# Patient Record
Sex: Male | Born: 1955 | Race: White | Hispanic: No | Marital: Married | Smoking: Never smoker
Health system: Southern US, Community
[De-identification: ages and names within clinical notes are randomized; demographics above are authoritative.]

## PROBLEM LIST (undated history)

## (undated) ENCOUNTER — Emergency Department (HOSPITAL_COMMUNITY): Payer: 59 | Source: Home / Self Care

---

## 2014-03-20 DIAGNOSIS — F411 Generalized anxiety disorder: Secondary | ICD-10-CM | POA: Insufficient documentation

## 2014-04-17 DIAGNOSIS — F09 Unspecified mental disorder due to known physiological condition: Secondary | ICD-10-CM | POA: Insufficient documentation

## 2015-12-03 ENCOUNTER — Ambulatory Visit: Payer: Self-pay | Admitting: Family Medicine

## 2015-12-11 ENCOUNTER — Encounter: Payer: Self-pay | Admitting: Family Medicine

## 2015-12-11 ENCOUNTER — Ambulatory Visit (INDEPENDENT_AMBULATORY_CARE_PROVIDER_SITE_OTHER): Payer: 59 | Admitting: Family Medicine

## 2015-12-11 VITALS — BP 132/84 | HR 71 | Ht 70.0 in | Wt 185.0 lb

## 2015-12-11 DIAGNOSIS — M79644 Pain in right finger(s): Secondary | ICD-10-CM

## 2015-12-11 NOTE — Patient Instructions (Signed)
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 These are the different medications you can take for this: Take tylenol 500mg  1-2 tabs three times a day for pain. Aleve 1-2 tabs twice a day with food Glucosamine sulfate 750mg  twice a day is a supplement that may help. Capsaicin, aspercreme, flexall 454 or biofreeze topically up to four times a day may also help with pain. Cortisone injections are an option - let me know if this gets bad enough that you want one. Heat 15 minutes at a time 3-4 times a day as needed to help with pain. Consider a U shaped aluminum splint to rest this by immobilizing the joint - we have these but it would be cheaper to buy it at a pharmacy. Follow up with me as needed otherwise.

## 2015-12-12 DIAGNOSIS — M79646 Pain in unspecified finger(s): Secondary | ICD-10-CM | POA: Insufficient documentation

## 2015-12-12 NOTE — Assessment & Plan Note (Signed)
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  Discussed tylenol, nsaids, glucosamine, topical medications, splinting, heat.  Consider injection if not improving.  F/u prn.

## 2015-12-12 NOTE — Progress Notes (Signed)
PCP: No primary care provider on file.  Subjective:   HPI: Patient is a 60 y.o. male here for right thumb pain.  Patient denies known injury. He states he's had off and on pain in right thumb for about a year. Feels like it locks up at IP joint region. + swelling. Has been taking aleve. Pain level is 4/10, sharp. No numbness or tingling. No skin changes.  No past medical history on file.  No current outpatient prescriptions on file prior to visit.   No current facility-administered medications on file prior to visit.    No past surgical history on file.  Allergies  Allergen Reactions  . Sulfa Antibiotics     Social History   Social History  . Marital Status: Married    Spouse Name: N/A  . Number of Children: N/A  . Years of Education: N/A   Occupational History  . Not on file.   Social History Main Topics  . Smoking status: Never Smoker   . Smokeless tobacco: Not on file  . Alcohol Use: Not on file  . Drug Use: Not on file  . Sexual Activity: Not on file   Other Topics Concern  . Not on file   Social History Narrative  . No narrative on file    No family history on file.  BP 132/84 mmHg  Pulse 71  Ht 5\' 10"  (1.778 m)  Wt 185 lb (83.915 kg)  BMI 26.54 kg/m2  Review of Systems: See HPI above.    Objective:  Physical Exam:  Gen: NAD, comfortable in exam room  Right hand: Nodes noted over dorsal DIP, PIP joints.  Swelling of IP joint of thumb.  No bruising, other deformity.  No malrotation or angulation. TTP circumferentially about IP joint.  No tenderness at A1 pulley of thumb.  No other tenderness. Full extension but flexion limited to 45 degrees at IP joint with mild pain.  No catching or locking on movement. NVI distally.    Assessment & Plan:  1. 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  Discussed tylenol, nsaids, glucosamine, topical medications, splinting, heat.  Consider injection if not improving.  F/u prn.

## 2015-12-24 ENCOUNTER — Ambulatory Visit: Payer: 59 | Admitting: Family Medicine

## 2016-01-20 ENCOUNTER — Other Ambulatory Visit (HOSPITAL_COMMUNITY): Payer: Self-pay | Admitting: Orthopaedic Surgery

## 2016-01-20 DIAGNOSIS — M25561 Pain in right knee: Secondary | ICD-10-CM

## 2016-01-24 ENCOUNTER — Ambulatory Visit (HOSPITAL_BASED_OUTPATIENT_CLINIC_OR_DEPARTMENT_OTHER): Payer: 59

## 2016-01-31 ENCOUNTER — Ambulatory Visit
Admission: RE | Admit: 2016-01-31 | Discharge: 2016-01-31 | Disposition: A | Discharge status: Home / Self Care | Payer: 59 | Source: Ambulatory Visit | Attending: Orthopaedic Surgery | Admitting: Orthopaedic Surgery

## 2016-01-31 DIAGNOSIS — M25561 Pain in right knee: Secondary | ICD-10-CM

## 2016-03-02 ENCOUNTER — Ambulatory Visit: Payer: 59 | Attending: Orthopedic Surgery | Admitting: Physical Therapy

## 2016-03-02 DIAGNOSIS — R2689 Other abnormalities of gait and mobility: Secondary | ICD-10-CM | POA: Insufficient documentation

## 2016-03-02 DIAGNOSIS — R262 Difficulty in walking, not elsewhere classified: Secondary | ICD-10-CM | POA: Insufficient documentation

## 2016-03-02 DIAGNOSIS — M25561 Pain in right knee: Secondary | ICD-10-CM | POA: Diagnosis present

## 2016-03-02 DIAGNOSIS — M25661 Stiffness of right knee, not elsewhere classified: Secondary | ICD-10-CM | POA: Insufficient documentation

## 2016-03-03 NOTE — Therapy (Signed)
[facility] [address]  [address] [place], [place], [address] Phone: [phone]   Fax:  [phone]  Physical Therapy Evaluation  Patient Details  Name: Darryl French MRN: [id number] Date of Birth: 12/06/1955 Referring Provider: Norlene Campbell, MD  Encounter Date: 03/02/2016      PT End of Session - 03/02/16 1107    Visit Number 1   Number of Visits 12   Date for PT Re-Evaluation 04/20/16   PT Start Time 1017   PT Stop Time 1107   PT Time Calculation (min) 50 min   Activity Tolerance Patient tolerated treatment well   Behavior During Therapy [facility] for tasks assessed/performed      No past medical history on file.  No past surgical history on file.  There were no vitals filed for this visit.       Subjective Assessment - 03/02/16 1024    Subjective Pt reports he injured his R knee playing disc golf in March or April of this year when he stepped off the tee platform while pivoting and felt a pop in his knee.    Pertinent History R knee arthroscopy 02/19/16   Limitations Sitting;Standing   How long can you sit comfortably? 30 minutes   How long can you stand comfortably? 20-30 minutes   How long can you walk comfortably? uncomfortable with all walking   Patient Stated Goals "develop more flexibility and get back to normal walking pattern"   Currently in Pain? Yes   Pain Score 2   Least 0/10, Avg 3-4/10, Worst 10/10   Pain Location Knee   Pain Orientation Right;Anterior   Pain Descriptors / Indicators Tightness;Aching;Sharp   Pain Type Surgical pain   Pain Radiating Towards anterior thigh   Pain Onset More than a month ago  initial onset 2-3 mo ago, surgery 2 wks ago   Pain Frequency Intermittent   Aggravating Factors  prolonged walking   Pain Relieving Factors Icing, elevation, OTC & prescription pain meds   Effect of Pain on Daily Activities Limps when walking, unable to squat, difficulty with lower body  dressing            [facility] PT Assessment - 03/02/16 1017    Assessment   Medical Diagnosis R knee scope   Referring Provider Norlene Campbell, MD   Onset Date/Surgical Date 02/19/16   Next MD Visit 03/05/16   Prior Therapy none   Balance Screen   Has the patient fallen in the past 6 months No   Has the patient had a decrease in activity level because of a fear of falling?  No   Is the patient reluctant to leave their home because of a fear of falling?  No   Home Environment   Living Environment Private residence   Type of Home House   Home Access Stairs to enter   Entrance Stairs-Number of Steps 3-4   Entrance Stairs-Rails Right   Home Layout Multi-level;Able to live on main level with bedroom/bathroom   Prior Function   Level of Independence Independent   Vocation Full time employment  currently out of work until 03/23/16   Vocation Requirements desk job   Leisure Scientist, physiological, gardening, hiking, outdoors activities   Observation/Other Assessments   Focus on Therapeutic Outcomes (FOTO)  Knee 23% (77% limitation); Predicted 57% (43% limitation)   ROM / Strength   AROM / PROM / Strength AROM;PROM;Strength   AROM   AROM Assessment Site Knee  Right/Left Knee Right;Left   Right Knee Extension 16   Right Knee Flexion 92   Left Knee Extension -2   Left Knee Flexion 143   PROM   PROM Assessment Site Knee   Right/Left Knee Right   Right Knee Extension 5   Right Knee Flexion 101   Strength   Strength Assessment Site Hip;Knee   Right/Left Hip Right;Left   Right Hip Flexion 4-/5   Right Hip Extension 3+/5   Right Hip ABduction 4/5   Right Hip ADduction 3/5   Left Hip Flexion 4/5   Left Hip Extension 4/5   Left Hip ABduction 4+/5   Left Hip ADduction 4/5   Right/Left Knee Right;Left   Right Knee Flexion 3+/5   Right Knee Extension 3+/5   Left Knee Flexion 4+/5   Left Knee Extension 4+/5   Flexibility   Soft Tissue Assessment /Muscle Length yes   Hamstrings mild  tightness   Quadriceps mod tightness on R   Palpation   Palpation comment increased edema throughout anterior & lateral knee with ttp over distal quad   Ambulation/Gait   Gait Pattern Antalgic;Decreased weight shift to right;Decreased stance time - right;Decreased hip/knee flexion - right;Right flexed knee in stance   Gait Comments Provided cues for normalized gait pattern with heel stike on weight acceptance         Today's Treatment  TherEx Quad set with rolled towel under knee 10x5" Supine hamstring stretch with strap 2x30" Hooklying Hip ADD isometric ball squeeze 10x5" 4 way SLR 10x3" (performed for flexion only but instructions/demo provided for remaining directions) Supine/longsitting AAROM heel slides with strap 10x3" Seated knee flexion/heel slides with foot on small green ball x20           PT Education - 03/02/16 1105    Education provided Yes   Education Details PT eval findings, POC, gait training for normalized gait pattern, and initial HEP   Person(s) Educated Patient   Methods Explanation;Demonstration;Handout   Comprehension Verbalized understanding;Returned demonstration;Need further instruction          PT Short Term Goals - 03/02/16 1108    PT SHORT TERM GOAL #1   Title Pt will be independent with initial HEP by 03/16/16   Status New           PT Long Term Goals - 03/02/16 1108    PT LONG TERM GOAL #1   Title Pt will be independent with advanced HEP/gym program by 04/20/16   Status New   PT LONG TERM GOAL #2   Title R knee ROM 3-125 or greater for normal gait and stair mechanics by 04/20/16   Status New   PT LONG TERM GOAL #3   Title R knee and B hip strength >/= 4+/5 by 04/20/16   Status New   PT LONG TERM GOAL #4   Title Pt will ambulate with normal gait pattern on all surfaces by 04/20/16   Status New   PT LONG TERM GOAL #5   Title Pt will negotiate stairs with normal reciprocal pattern by 04/20/16   Status New                Plan - 03/02/16 1107    Clinical Impression Statement Darryl French is a 60 y/o male who presents to OP PT 12 days s/p R knee arthroscopy on 02/19/16. Pt presents to PT w/o AD for ambulation but demonstrates an antalgic gait pattern with decreased step length, decreased R hip and knee flexion during  swing through, and decreased heel strike and knee extension on weight acceptance with R knee flexed during stance phase of gait. Pain currently 2/10, but reports pain 3-4/10 on average with worst pain up to 10/10, typically at night. Pain limiting tolerance for sitting and walking, requiring frequent changes of position in effort to find comfortable position. Assessment reveals R knee AROM 16-92 and PROM 5-101. Tightness noted in B hamstrings, quads and gastrocs, R > L, along with decreased patellar mobility and generalized edema at R knee. R knee strength 3+/5 for both flexion and extension within available range, with weakness also noted in B hips with MMT as above. POC will focus on improving LE soft tissue pliability, increasing R knee ROM, core/LE strengthening and stability training, gait training for normalized gait pattern, with manual therapy PRN for ROM/pain/edema and modalities PRN for pain/edema.   Rehab Potential Good   PT Frequency 2x / week   PT Duration 6 weeks   PT Treatment/Interventions Patient/family education;Therapeutic exercise;Manual techniques;Passive range of motion;Taping;Neuromuscular re-education;Balance training;Electrical Stimulation;Cryotherapy;Vasopneumatic Device;Iontophoresis 4mg /ml Dexamethasone;Gait training;Stair training;Therapeutic activities;ADLs/Self Care Home Management   PT Next Visit Plan Review initial HEP; R knee ROM & strengthening; B hip/core strengthening; Manual therapy and Modalities PRN for edema and pain management   Consulted and Agree with Plan of Care Patient      Patient will benefit from skilled therapeutic intervention in order to improve the following  deficits and impairments:  Pain, Impaired flexibility, Decreased range of motion, Decreased strength, Difficulty walking, Abnormal gait, Decreased activity tolerance, Increased edema  Visit Diagnosis: Stiffness of right knee, not elsewhere classified  Pain in right knee  Difficulty in walking, not elsewhere classified  Other abnormalities of gait and mobility     Problem List Patient Active Problem List   Diagnosis Date Noted  . Thumb pain 12/12/2015  . Mild cognitive disorder 04/17/2014  . Anxiety, generalized 03/20/2014    Marry GuanJoAnne M Mikhail Hallenbeck, PT, MPT 03/03/2016, 10:50 AM  [facility] [address]  [address] [place], [place], [address] Phone: [phone]   Fax:  [phone]  Name: Darryl French MRN: [id number] Date of Birth: 06/24/1956

## 2016-03-04 ENCOUNTER — Ambulatory Visit: Payer: 59

## 2016-03-04 DIAGNOSIS — M25561 Pain in right knee: Secondary | ICD-10-CM

## 2016-03-04 DIAGNOSIS — M25661 Stiffness of right knee, not elsewhere classified: Secondary | ICD-10-CM | POA: Diagnosis not present

## 2016-03-04 DIAGNOSIS — R2689 Other abnormalities of gait and mobility: Secondary | ICD-10-CM

## 2016-03-04 DIAGNOSIS — R262 Difficulty in walking, not elsewhere classified: Secondary | ICD-10-CM

## 2016-03-04 NOTE — Therapy (Signed)
[facility] [address]  [address] [place], [place], [address] Phone: [phone]   Fax:  [phone]  Physical Therapy Treatment  Patient Details  Name: Darryl RoughenDana G Kurka MRN: [id number] Date of Birth: 01/10/1956 Referring Provider: Norlene CampbellPeter Whitfield, MD  Encounter Date: 03/04/2016      PT End of Session - 03/04/16 1133    Visit Number 2   Number of Visits 12   Date for PT Re-Evaluation 04/20/16   PT Start Time 1107   PT Stop Time 1155   PT Time Calculation (min) 48 min   Activity Tolerance Patient tolerated treatment well   Behavior During Therapy [facility] for tasks assessed/performed      History reviewed. No pertinent past medical history.  History reviewed. No pertinent past surgical history.  There were no vitals filed for this visit.      Subjective Assessment - 03/04/16 1118    Subjective Pt. reports he has not been able to perform any HEP activities since evaluation, however intends to start performing them tomorrow.     Patient Stated Goals "develop more flexibility and get back to normal walking pattern"   Currently in Pain? Yes   Pain Score 2    Pain Location Knee   Pain Orientation Right;Anterior   Pain Descriptors / Indicators Tightness;Aching;Sharp   Pain Type Surgical pain   Pain Onset More than a month ago       Today's Treatment:  TherEx: R HS, glute, SKTC stretch  Quad set with rolled towel under knee 10x5" Hooklying Hip ADD isometric ball squeeze 10x5" 4 way SLR 10x3" hip adduction, abduction, flexion, extension with 2# cuffweight on ankle  Hooklying bridge x 10 reps   Hooklying bridge with B hip abd/ER with blue TB x 10 reps  Hooklying alternating Hip abd/ER with blue TB x 10 reps each side Hooklying bridge with adduction squeeze x 10 reps  B heel raise at UBE x 15 reps         PT Short Term Goals - 03/04/16 1135    PT SHORT TERM GOAL #1   Title Pt will be independent with initial HEP  by 03/16/16   Status On-going           PT Long Term Goals - 03/04/16 1135    PT LONG TERM GOAL #1   Status On-going   PT LONG TERM GOAL #2   Status On-going   PT LONG TERM GOAL #3   Status On-going   PT LONG TERM GOAL #4   Status On-going   PT LONG TERM GOAL #5   Status On-going               Plan - 03/04/16 1135    Clinical Impression Statement Pt. reports he has not been able to perform any HEP activities since evaluation, however intends to start performing them tomorrow.  Today's treatment focused on HEP review and advancement of hip / knee strengthening activity; pt. able to perform all HEP activities well however with poor recall.  Pt. tolerated bridging well today thus bridging and bridge variations performed throughout therex.    PT Treatment/Interventions Patient/family education;Therapeutic exercise;Manual techniques;Passive range of motion;Taping;Neuromuscular re-education;Balance training;Electrical Stimulation;Cryotherapy;Vasopneumatic Device;Iontophoresis 4mg /ml Dexamethasone;Gait training;Stair training;Therapeutic activities;ADLs/Self Care Home Management   PT Next Visit Plan R knee ROM & strengthening; B hip/core strengthening; Manual therapy and Modalities PRN for edema and pain management      Patient will benefit from skilled therapeutic intervention in  order to improve the following deficits and impairments:  Pain, Impaired flexibility, Decreased range of motion, Decreased strength, Difficulty walking, Abnormal gait, Decreased activity tolerance, Increased edema  Visit Diagnosis: Stiffness of right knee, not elsewhere classified  Pain in right knee  Difficulty in walking, not elsewhere classified  Other abnormalities of gait and mobility     Problem List Patient Active Problem List   Diagnosis Date Noted  . Thumb pain 12/12/2015  . Mild cognitive disorder 04/17/2014  . Anxiety, generalized 03/20/2014    Kermit BaloMicah Nereida Schepp, PTA 03/04/2016, 1:18  PM  [facility] [address]  [address] [place], [place], [address] Phone: [phone]   Fax:  [phone]  Name: Darryl RoughenDana G Evrard MRN: [id number] Date of Birth: 08/11/1956

## 2016-03-08 ENCOUNTER — Ambulatory Visit: Payer: 59 | Attending: Orthopedic Surgery

## 2016-03-08 DIAGNOSIS — R262 Difficulty in walking, not elsewhere classified: Secondary | ICD-10-CM | POA: Insufficient documentation

## 2016-03-08 DIAGNOSIS — R2689 Other abnormalities of gait and mobility: Secondary | ICD-10-CM | POA: Diagnosis present

## 2016-03-08 DIAGNOSIS — M25561 Pain in right knee: Secondary | ICD-10-CM

## 2016-03-08 DIAGNOSIS — M25661 Stiffness of right knee, not elsewhere classified: Secondary | ICD-10-CM | POA: Diagnosis present

## 2016-03-08 NOTE — Therapy (Signed)
[facility] [address]  [address] [place], [place], [address] Phone: [phone]   Fax:  [phone]  Physical Therapy Treatment  Patient Details  Name: Darryl French MRN: [id number] Date of Birth: 03/02/1956 Referring Provider: Norlene CampbellPeter whitfield, MD  Encounter Date: 03/08/2016      PT End of Session - 03/08/16 1418    Visit Number 3   Number of Visits 12   Date for PT Re-Evaluation 04/20/16   PT Start Time 1407   PT Stop Time 1450   PT Time Calculation (min) 43 min   Activity Tolerance Patient tolerated treatment well   Behavior During Therapy [facility] for tasks assessed/performed      History reviewed. No pertinent past medical history.  History reviewed. No pertinent past surgical history.  There were no vitals filed for this visit.      Subjective Assessment - 03/08/16 1413    Subjective Pt. reports he has not been able to perform any HEP activities over the weekend and was seen initially today wearing a brace on the R knee.      Patient Stated Goals "develop more flexibility and get back to normal walking pattern"   Currently in Pain? No/denies   Pain Score 0-No pain   Multiple Pain Sites No            [facility] PT Assessment - 03/08/16 0001    Assessment   Medical Diagnosis R knee scope   Referring Provider Norlene CampbellPeter whitfield, MD   Next MD Visit 03/17/16   AROM   AROM Assessment Site Knee   Right/Left Knee Right;Left   Right Knee Extension 8   Right Knee Flexion 109   PROM   PROM Assessment Site Knee   Right/Left Knee Right   Right Knee Extension 5   Right Knee Flexion 113       Today's Treatment:  TherEx: R HS, glute, SKTC stretch x 30 sec R Quad set with black bolster under heel 5" x 10 reps   Hooklying bridge x 15 reps  Hooklying bridge with B hip abd/ER with black TB x 10 reps  Hooklying alternating Hip abd/ER with black TB x 10 reps each side Hooklying abdominal bracing with alternating LE  march with black TB x 10 reps  B sidelying clam shell with black TB x 10 reps each side  B bridge with alternating quad set x 5 each leg    Education: Explanation of deconditioning process and atrophy to R LE muscles following surgery as rationale for continued importance of HEP adherence   ROM assessment          PT Short Term Goals - 03/04/16 1135    PT SHORT TERM GOAL #1   Title Pt will be independent with initial HEP by 03/16/16   Status On-going           PT Long Term Goals - 03/08/16 1438    PT LONG TERM GOAL #1   Title Pt will be independent with advanced HEP/gym program by 04/20/16   Status On-going   PT LONG TERM GOAL #2   Title R knee ROM 3-125 or greater for normal gait and stair mechanics by 04/20/16   Status On-going   PT LONG TERM GOAL #3   Title R knee and B hip strength >/= 4+/5 by 04/20/16   Status On-going   PT LONG TERM GOAL #4   Title Pt will ambulate with normal gait pattern on  all surfaces by 04/20/16   Status On-going   PT LONG TERM GOAL #5   Title Pt will negotiate stairs with normal reciprocal pattern by 04/20/16   Status On-going               Plan - 03/08/16 1419    Clinical Impression Statement Pt. reports he has not been able to perform any HEP activities over the weekend and was seen initially today wearing a brace on the R knee.   Today's treatment focused on advancement of supine bridge and bridge variation activities with black TB; pt. continues to report significant weakness and fatigue at R hip/LE.  Pt. R knee passive and AROM significantly improved greatly improved today.  Pt. admits to being very inconsistent with HEP at this time and would benefit from continued hip / knee strengthening to increase activity tolerance.     PT Treatment/Interventions Patient/family education;Therapeutic exercise;Manual techniques;Passive range of motion;Taping;Neuromuscular re-education;Balance training;Electrical Stimulation;Cryotherapy;Vasopneumatic  Device;Iontophoresis 4mg /ml Dexamethasone;Gait training;Stair training;Therapeutic activities;ADLs/Self Care Home Management   PT Next Visit Plan R knee ROM & strengthening; B hip/core strengthening; Manual therapy and Modalities PRN for edema and pain management      Patient will benefit from skilled therapeutic intervention in order to improve the following deficits and impairments:  Pain, Impaired flexibility, Decreased range of motion, Decreased strength, Difficulty walking, Abnormal gait, Decreased activity tolerance, Increased edema  Visit Diagnosis: Stiffness of right knee, not elsewhere classified  Pain in right knee  Difficulty in walking, not elsewhere classified  Other abnormalities of gait and mobility     Problem List Patient Active Problem List   Diagnosis Date Noted  . Thumb pain 12/12/2015  . Mild cognitive disorder 04/17/2014  . Anxiety, generalized 03/20/2014    Kermit BaloMicah Sukhman Kocher, PTA 03/08/2016, 3:37 PM  [facility] [address]  [address] [place], [place], [address] Phone: [phone]   Fax:  [phone]  Name: Darryl French MRN: [id number] Date of Birth: 10/24/1955

## 2016-03-11 ENCOUNTER — Encounter (INDEPENDENT_AMBULATORY_CARE_PROVIDER_SITE_OTHER): Payer: Self-pay

## 2016-03-11 ENCOUNTER — Ambulatory Visit: Payer: 59

## 2016-03-11 DIAGNOSIS — M25661 Stiffness of right knee, not elsewhere classified: Secondary | ICD-10-CM | POA: Diagnosis not present

## 2016-03-11 DIAGNOSIS — R2689 Other abnormalities of gait and mobility: Secondary | ICD-10-CM

## 2016-03-11 DIAGNOSIS — R262 Difficulty in walking, not elsewhere classified: Secondary | ICD-10-CM

## 2016-03-11 DIAGNOSIS — M25561 Pain in right knee: Secondary | ICD-10-CM

## 2016-03-11 NOTE — Therapy (Signed)
[facility] [place] [address]  [address] [place], [place], [address] Phone: [phone]   Fax:  [phone]  Physical Therapy Treatment  Patient Details  Name: Darryl French MRN: [id number] Date of Birth: 02/25/1956 Referring Provider: Norlene CampbellPeter whitfield, MD  Encounter Date: 03/11/2016      PT End of Session - 03/11/16 0956    Visit Number 4   Number of Visits 12   Date for PT Re-Evaluation 04/20/16   PT Start Time 0935   PT Stop Time 1015   PT Time Calculation (min) 40 min   Activity Tolerance Patient tolerated treatment well   Behavior [facility] Therapy [facility] for tasks assessed/performed      History reviewed. No pertinent past medical history.  History reviewed. No pertinent past surgical history.  There were no vitals filed for this visit.      Subjective Assessment - 03/11/16 0939    Subjective Pt. reports he has been able to perform the HEP once a day since last treatment and is feeling pretty good today.     Patient Stated Goals "develop more flexibility and get back to normal walking pattern"   Currently in Pain? No/denies   Pain Score 0-No pain   Multiple Pain Sites No      Today's Treatment:  TherEx: R HS, glute, SKTC stretch x 30 sec Hooklying bridge x 15 reps  Hooklying bridge with B hip abd/ER with black TB x 10 reps  Hooklying bridge with alternating Hip abd/ER with black TB x 10 reps each side Hooklying abdominal bracing with alternating LE march with black TB x 10 reps  B sidelying clam shell with black TB x 10 reps each side  4" eccentric step over x 5 each way 6" R step up with therapist pulling black TB into flexion x 15 reps  TRX functional squat x 15 reps; ~ 80 dg  Stairs navigation:  Pt. Able to ascend / descend stairs without rail use x 6 stairs with supervision from therapist; pt. Still with tendency to circumduct R hip with descending           PT Short Term Goals - 03/04/16 1135    PT SHORT TERM GOAL #1   Title Pt will be independent with initial HEP by 03/16/16   Status On-going           PT Long Term Goals - 03/08/16 1438    PT LONG TERM GOAL #1   Title Pt will be independent with advanced HEP/gym program by 04/20/16   Status On-going   PT LONG TERM GOAL #2   Title R knee ROM 3-125 or greater for normal gait and stair mechanics by 04/20/16   Status On-going   PT LONG TERM GOAL #3   Title R knee and B hip strength >/= 4+/5 by 04/20/16   Status On-going   PT LONG TERM GOAL #4   Title Pt will ambulate with normal gait pattern on all surfaces by 04/20/16   Status On-going   PT LONG TERM GOAL #5   Title Pt will negotiate stairs with normal reciprocal pattern by 04/20/16   Status On-going               Plan - 03/11/16 0957    Clinical Impression Statement Pt. reports he has been able to perform the HEP once a day since last treatment and is feeling pretty good today.  Pt. tolerated increased repetitions with stepping activities today  and able to ambulate with improved wt. shift; pt. seen today wearing R knee brace and expressed desire to continue wearing brace throughout day; pt. instructed to slowly ween off using knee brace in the coming weeks.  Pt. progressing well however requires frequent verbal cueing throughout therex for proper technique and pacing.     PT Treatment/Interventions Patient/family education;Therapeutic exercise;Manual techniques;Passive range of motion;Taping;Neuromuscular re-education;Balance training;Electrical Stimulation;Cryotherapy;Vasopneumatic Device;Iontophoresis 4mg /ml Dexamethasone;Gait training;Stair training;Therapeutic activities;ADLs/Self Care Home Management   PT Next Visit Plan R knee ROM & strengthening; B hip/core strengthening; Manual therapy and Modalities PRN for edema and pain management      Patient will benefit from skilled therapeutic intervention in order to improve the following deficits and impairments:  Pain,  Impaired flexibility, Decreased range of motion, Decreased strength, Difficulty walking, Abnormal gait, Decreased activity tolerance, Increased edema  Visit Diagnosis: Stiffness of right knee, not elsewhere classified  Pain in right knee  Difficulty in walking, not elsewhere classified  Other abnormalities of gait and mobility     Problem List Patient Active Problem List   Diagnosis Date Noted  . Thumb pain 12/12/2015  . Mild cognitive disorder 04/17/2014  . Anxiety, generalized 03/20/2014    Kermit BaloMicah Derrika Ruffalo, PTA 03/11/2016, 5:46 PM  [facility] [place] [address]  [address] [place], [place], [address] Phone: [phone]   Fax:  [phone]  Name: Darryl French MRN: [id number] Date of Birth: 01/19/1956

## 2016-03-15 ENCOUNTER — Ambulatory Visit: Payer: 59 | Admitting: Physical Therapy

## 2016-03-15 DIAGNOSIS — R2689 Other abnormalities of gait and mobility: Secondary | ICD-10-CM

## 2016-03-15 DIAGNOSIS — M25661 Stiffness of right knee, not elsewhere classified: Secondary | ICD-10-CM

## 2016-03-15 DIAGNOSIS — M25561 Pain in right knee: Secondary | ICD-10-CM

## 2016-03-15 DIAGNOSIS — R262 Difficulty in walking, not elsewhere classified: Secondary | ICD-10-CM

## 2016-03-15 NOTE — Therapy (Signed)
[facility] [place] [address]  [place], [place], [address] Phone: [phone]   Fax:  [phone]  Physical Therapy Treatment  Patient Details  Name: Darryl French MRN: [id number] Date of Birth: 1956-04-24 Referring Provider: Joni Fears, MD  Encounter Date: 03/15/2016      PT End of Session - 03/15/16 0931    Visit Number 5   Number of Visits 12   Date for PT Re-Evaluation 04/20/16   PT Start Time 0931   PT Stop Time 1017   PT Time Calculation (min) 46 min   Activity Tolerance Patient tolerated treatment well   Behavior During Therapy [facility] for tasks assessed/performed      No past medical history on file.  No past surgical history on file.  There were no vitals filed for this visit.      Subjective Assessment - 03/15/16 0936    Subjective Pt currently w/o pain but states he did have to take 2 ibuprofen this morning. Reports he had been more active this weekend which may be why it was a little sore this weekend.   Patient Stated Goals "develop more flexibility and get back to normal walking pattern"   Currently in Pain? No/denies            [facility] PT Assessment - 03/15/16 0931    Assessment   Medical Diagnosis R knee scope   Referring Provider Joni Fears, MD   Next MD Visit 03/17/16   AROM   AROM Assessment Site Knee   Right/Left Knee Right   Right Knee Extension 6   Right Knee Flexion 121   PROM   PROM Assessment Site Knee   Right/Left Knee Right   Right Knee Extension 2   Right Knee Flexion 130   Strength   Strength Assessment Site Hip;Knee   Right/Left Hip Right;Left   Right Hip Flexion 4/5   Right Hip Extension 4-/5   Right Hip ABduction 4+/5   Right Hip ADduction 3+/5   Left Hip Flexion 4+/5   Left Hip Extension 4/5   Left Hip ABduction 4+/5   Left Hip ADduction 4/5   Right Knee Flexion 4-/5   Right Knee Extension 4-/5   Left Knee Flexion 4+/5   Left Knee Extension 4+/5           Today's Treatment  TherEx Rec Bike - lvl 2 x 5 Manual R HS, glute, SKTC stretch x 30" each B HS curl with heels on peanut ball x15 B straight leg bridge with heels on peanut ball x15  Gait/Stair training Pt able to demo normal gait pattern with good weight shift to R, proper heel strike and good heel-toe progression but pt states it continues to require conscious effort Trained pt in reciprocal pattern with pt able to ascend with near symmetrical pattern, but lack eccentric control with descent  ROM/MMT check  Goal Assessment  TherEx Standing B 4-way SLR with red TB x10 (added to HEP)          PT Education - 03/15/16 1028    Education provided Yes   Education Details 4-way SLR with red TB added to HEP   Person(s) Educated Patient   Methods Explanation;Demonstration;Verbal cues;Handout   Comprehension Verbalized understanding;Returned demonstration;Verbal cues required;Need further instruction          PT Short Term Goals - 03/15/16 0948    PT SHORT TERM GOAL #1   Title Pt will be independent with initial  HEP by 03/16/16   Status Achieved           PT Long Term Goals - 03/15/16 0948    PT LONG TERM GOAL #1   Title Pt will be independent with advanced HEP/gym program by 04/20/16   Status On-going   PT LONG TERM GOAL #2   Title R knee ROM 3-125 or greater for normal gait and stair mechanics by 04/20/16   Status On-going   PT LONG TERM GOAL #3   Title R knee and B hip strength >/= 4+/5 by 04/20/16   Status On-going   PT LONG TERM GOAL #4   Title Pt will ambulate with normal gait pattern on all surfaces by 04/20/16   Status Partially Met  Able to demostrate normal gait pattern, but requires conscious effort   PT LONG TERM GOAL #5   Title Pt will negotiate stairs with normal reciprocal pattern by 04/20/16   Status Partially Met  Able to ascend with symmetrical pattern, but continues to lack eccentric control on descent               Plan -  03/15/16 1017    Clinical Impression Statement Pt demonstrating good initial progress with PT. R knee ROM improve to 6-121 AROM (16-92 on eval) & 2-130 PROM (5-101) and B LE strength imporved by grossly 1/2 grade overall, but strength remains limited in B hips and R knee. Pt now able to ambulate with normal gait pattern but pt reports it continues to require a conscious effort on his part. Pt starting to negotiate stairs reciprocally and can ascend with mostly symmetrical step pattern, but continues to lack R eccentric control on descent.    PT Treatment/Interventions Patient/family education;Therapeutic exercise;Manual techniques;Passive range of motion;Taping;Neuromuscular re-education;Balance training;Electrical Stimulation;Cryotherapy;Vasopneumatic Device;Iontophoresis 55m/ml Dexamethasone;Gait training;Stair training;Therapeutic activities;ADLs/Self Care Home Management   PT Next Visit Plan R knee ROM & strengthening; B hip/core strengthening; Manual therapy and Modalities PRN for edema and pain management   Consulted and Agree with Plan of Care Patient      Patient will benefit from skilled therapeutic intervention in order to improve the following deficits and impairments:  Pain, Impaired flexibility, Decreased range of motion, Decreased strength, Difficulty walking, Abnormal gait, Decreased activity tolerance, Increased edema  Visit Diagnosis: Stiffness of right knee, not elsewhere classified  Pain in right knee  Difficulty in walking, not elsewhere classified  Other abnormalities of gait and mobility     Problem List Patient Active Problem List   Diagnosis Date Noted  . Thumb pain 12/12/2015  . Mild cognitive disorder 04/17/2014  . Anxiety, generalized 03/20/2014    JPercival Spanish PT, MPT 03/15/2016, 12:41 PM  [facility] [place] [address]Phone: [phone]  Fax:   [phone] Name: Darryl PLATTEMRN: [id number] of Birth: [birth year]

## 2016-03-17 ENCOUNTER — Ambulatory Visit: Payer: 59

## 2016-03-17 DIAGNOSIS — M25561 Pain in right knee: Secondary | ICD-10-CM

## 2016-03-17 DIAGNOSIS — M25661 Stiffness of right knee, not elsewhere classified: Secondary | ICD-10-CM

## 2016-03-17 DIAGNOSIS — R2689 Other abnormalities of gait and mobility: Secondary | ICD-10-CM

## 2016-03-17 DIAGNOSIS — R262 Difficulty in walking, not elsewhere classified: Secondary | ICD-10-CM

## 2016-03-17 NOTE — Therapy (Signed)
[place] [address]  [place], [place], [address] Phone: [phone]   Fax:  [phone]  Physical Therapy Treatment  Patient Details  Name: Darryl French MRN: [id number] Date of Birth: Feb 06, 1956 Referring Provider: Joni Fears, MD  Encounter Date: 03/17/2016      PT End of Session - 03/17/16 1330    Visit Number 6   Number of Visits 12   Date for PT Re-Evaluation 04/20/16   PT Start Time 2878   PT Stop Time 1400   PT Time Calculation (min) 41 min   Activity Tolerance Patient tolerated treatment well   Behavior During Therapy [facility] for tasks assessed/performed      History reviewed. No pertinent past medical history.  History reviewed. No pertinent past surgical history.  There were no vitals filed for this visit.      Subjective Assessment - 03/17/16 1325    Subjective Pt. reports only R knee stiffness and weakness initially today.     Patient Stated Goals "develop more flexibility and get back to normal walking pattern"   Currently in Pain? No/denies   Pain Score 0-No pain   Multiple Pain Sites No     Today's Treatment:  Therex:  Recumbent bike: level 2, 6 min  R HS, glute, SKTC, PF stretch 2 x 30 sec  Fitter hip extension, abduction (1 black band) x 15 reps  R sidelying R hip adduction 2# x 15 reps Functional squating with 6# dumbbell x 15 reps 6" R step R dorsiflexion rocker 2 x 30 sec   BATCA HS machine 45# x 10 reps; B concentric, B eccentric TRX squat x 15 reps; pt. With slight L wt. Shift and tendency for B knees to flex past toes, verbal cues to "sit back into squat"           PT Short Term Goals - 03/15/16 0948    PT SHORT TERM GOAL #1   Title Pt will be independent with initial HEP by 03/16/16   Status Achieved           PT Long Term Goals - 03/15/16 0948    PT LONG TERM GOAL #1   Title Pt will be independent with advanced HEP/gym program by 04/20/16   Status  On-going   PT LONG TERM GOAL #2   Title R knee ROM 3-125 or greater for normal gait and stair mechanics by 04/20/16   Status On-going   PT LONG TERM GOAL #3   Title R knee and B hip strength >/= 4+/5 by 04/20/16   Status On-going   PT LONG TERM GOAL #4   Title Pt will ambulate with normal gait pattern on all surfaces by 04/20/16   Status Partially Met  Able to demostrate normal gait pattern, but requires conscious effort   PT LONG TERM GOAL #5   Title Pt will negotiate stairs with normal reciprocal pattern by 04/20/16   Status Partially Met  Able to ascend with symmetrical pattern, but continues to lack eccentric control on descent               Plan - 03/17/16 1330    Clinical Impression Statement Pt. reports only R knee stiffness and weakness initially today.  No other pain or complaints reported.  Pt. able to tolerate progression to more advanced squating activities and increased resistance with HS curl well with not pain; pt. would benefit from further skilled instruction with functional squating technique;  pt. still with tendancy to lean too far forward on squat.  Pt. reports MD was pleased with pt. progress with PT at this point.     PT Treatment/Interventions Patient/family education;Therapeutic exercise;Manual techniques;Passive range of motion;Taping;Neuromuscular re-education;Balance training;Electrical Stimulation;Cryotherapy;Vasopneumatic Device;Iontophoresis 49m/ml Dexamethasone;Gait training;Stair training;Therapeutic activities;ADLs/Self Care Home Management   PT Next Visit Plan R knee ROM & strengthening; B hip/core strengthening; Manual therapy and Modalities PRN for edema and pain management      Patient will benefit from skilled therapeutic intervention in order to improve the following deficits and impairments:  Pain, Impaired flexibility, Decreased range of motion, Decreased strength, Difficulty walking, Abnormal gait, Decreased activity tolerance, Increased  edema  Visit Diagnosis: Stiffness of right knee, not elsewhere classified  Pain in right knee  Difficulty in walking, not elsewhere classified  Other abnormalities of gait and mobility     Problem List Patient Active Problem List   Diagnosis Date Noted  . Thumb pain 12/12/2015  . Mild cognitive disorder 04/17/2014  . Anxiety, generalized 03/20/2014    MBess Harvest PTA 03/17/2016, 3:21 PM  [facility] [place] [address]Phone: [phone]  Fax:  [phone] Name: Darryl LANIGANMRN: [id number] of Birth: [birth year]

## 2016-03-22 ENCOUNTER — Ambulatory Visit: Payer: 59

## 2016-03-24 ENCOUNTER — Ambulatory Visit: Payer: 59 | Admitting: Physical Therapy

## 2016-03-24 DIAGNOSIS — R262 Difficulty in walking, not elsewhere classified: Secondary | ICD-10-CM

## 2016-03-24 DIAGNOSIS — M25561 Pain in right knee: Secondary | ICD-10-CM

## 2016-03-24 DIAGNOSIS — M25661 Stiffness of right knee, not elsewhere classified: Secondary | ICD-10-CM | POA: Diagnosis not present

## 2016-03-24 DIAGNOSIS — R2689 Other abnormalities of gait and mobility: Secondary | ICD-10-CM

## 2016-03-24 NOTE — Therapy (Signed)
[facility] [address]  [place], [place], [address] Phone: [phone]   Fax:  [phone]  Physical Therapy Treatment  Patient Details  Name: Darryl French MRN: [id number] Date of Birth: 18-May-1956 Referring Provider: Joni Fears, MD  Encounter Date: 03/24/2016      PT End of Session - 03/24/16 0801    Visit Number 7   Number of Visits 12   Date for PT Re-Evaluation 04/20/16   PT Start Time 0801   PT Stop Time 0852   PT Time Calculation (min) 51 min   Activity Tolerance Patient tolerated treatment well   Behavior During Therapy [facility] for tasks assessed/performed      No past medical history on file.  No past surgical history on file.  There were no vitals filed for this visit.      Subjective Assessment - 03/24/16 0806    Subjective Pt reporting increased soreness esp in anterior shin after last therapy session but also notes the same soreness when not wearing his brace or by the end of the day. Relieved with Ibuprofen.   Patient Stated Goals "develop more flexibility and get back to normal walking pattern"   Currently in Pain? No/denies            [facility] PT Assessment - 03/24/16 0801    Assessment   Medical Diagnosis R knee scope   Referring Provider Joni Fears, MD   Next MD Visit late August   AROM   AROM Assessment Site Knee   Right/Left Knee Right   Right Knee Extension 5   Right Knee Flexion 120   PROM   PROM Assessment Site Knee   Right/Left Knee Right   Right Knee Extension 0          Today's Treatment  TherEx  Rec bike - level 2 x 6'  Manual R HS, glute, SKTC stretch x 30" each 6" R Fwd Step-up with black TB TKE x15 6" Lat Step-up x15 6" Crossover Lat Step-up x15 Standing B 4-way SLR with red TB standing with opposite foot on blue foam oval x10 each TRX squat x15 - VC's for even weight shift and to "sit back into squat"  Countertop squat x10 (pt to practice at  home)  Gait/Stair training VC's for even weight shift R vs L, proper heel strike and good heel-toe progression with gait as pt continues to demonstrate antalgic pattern on R despite denying pain Reviewed reciprocal pattern on stairs with single rail +/- [facility] for both ascent & descent as pt reports reguarly using stairs to go to the BR at work; remains with limited eccentric control on descent but improving          PT Education - 03/24/16 0850    Education Details Countertop squat added to HEP; review of proper gait pattern and stair ascent/descent   Person(s) Educated Patient   Methods Explanation;Demonstration   Comprehension Verbalized understanding;Returned demonstration;Need further instruction          PT Short Term Goals - 03/15/16 0948    PT SHORT TERM GOAL #1   Title Pt will be independent with initial HEP by 03/16/16   Status Achieved           PT Long Term Goals - 03/24/16 0852    PT LONG TERM GOAL #1   Title Pt will be independent with advanced HEP/gym program by 04/20/16   Status On-going   PT LONG TERM GOAL #  2   Title R knee ROM 3-125 or greater for normal gait and stair mechanics by 04/20/16   Status On-going   PT LONG TERM GOAL #3   Title R knee and B hip strength >/= 4+/5 by 04/20/16   Status On-going   PT LONG TERM GOAL #4   Title Pt will ambulate with normal gait pattern on all surfaces by 04/20/16   Status Partially Met  Able to demostrate normal gait pattern, but continues to require conscious effort   PT LONG TERM GOAL #5   Title Pt will negotiate stairs with normal reciprocal pattern by 04/20/16   Status Partially Met  Able to ascend with symmetrical pattern with cues for correct placement of SPC. Improving reciprocal descent but continues to lack eccentric control on R.               Plan - 03/24/16 0902    Clinical Impression Statement Pt has returned to work this week and reports no problems other than increased fatigue by end of day. Pt  using stairs at work when needs to go to [facility], thereofore reviewed proper stair technique to avoid substitution and discourage bad habits as pt describing incorrect technique with SPC. Pt tolerating progression to include proprioceptive training well, but continues to demonstrate incorrect technique with functional squat using TRX support, therefore instructed in functional squat to be done at home using countertop support.   PT Treatment/Interventions Patient/family education;Therapeutic exercise;Manual techniques;Passive range of motion;Taping;Neuromuscular re-education;Balance training;Electrical Stimulation;Cryotherapy;Vasopneumatic Device;Iontophoresis 67m/ml Dexamethasone;Gait training;Stair training;Therapeutic activities;ADLs/Self Care Home Management   PT Next Visit Plan R knee ROM & strengthening; B hip/core strengthening; Manual therapy and Modalities PRN for edema and pain management      Patient will benefit from skilled therapeutic intervention in order to improve the following deficits and impairments:  Pain, Impaired flexibility, Decreased range of motion, Decreased strength, Difficulty walking, Abnormal gait, Decreased activity tolerance, Increased edema  Visit Diagnosis: Stiffness of right knee, not elsewhere classified  Pain in right knee  Difficulty in walking, not elsewhere classified  Other abnormalities of gait and mobility     Problem List Patient Active Problem List   Diagnosis Date Noted  . Thumb pain 12/12/2015  . Mild cognitive disorder 04/17/2014  . Anxiety, generalized 03/20/2014    JPercival Spanish PT, MPT 03/24/2016, 9:10 AM  [facility] [place] [address]Phone: [phone]  Fax:  [phone] Name: Darryl TREADWAYMRN: [id number] of Birth: [birth year]

## 2016-03-29 ENCOUNTER — Ambulatory Visit: Payer: 59 | Admitting: Physical Therapy

## 2016-03-29 DIAGNOSIS — R262 Difficulty in walking, not elsewhere classified: Secondary | ICD-10-CM

## 2016-03-29 DIAGNOSIS — M25661 Stiffness of right knee, not elsewhere classified: Secondary | ICD-10-CM | POA: Diagnosis not present

## 2016-03-29 DIAGNOSIS — M25561 Pain in right knee: Secondary | ICD-10-CM

## 2016-03-29 DIAGNOSIS — R2689 Other abnormalities of gait and mobility: Secondary | ICD-10-CM

## 2016-03-29 NOTE — Therapy (Signed)
[facility] [address]  [place], [place], [address] Phone: [phone]   Fax:  [phone]  Physical Therapy Treatment  Patient Details  Name: Darryl French MRN: [id number] Date of Birth: [birth year] Referring Provider: Joni Fears, MD  Encounter Date: 03/29/2016      PT End of Session - 03/29/16 0805    Visit Number 8   Number of Visits 12   Date for PT Re-Evaluation 04/20/16   PT Start Time 0805   PT Stop Time 8828   PT Time Calculation (min) 42 min   Activity Tolerance Patient tolerated treatment well   Behavior During Therapy [facility] for tasks assessed/performed      No past medical history on file.  No past surgical history on file.  There were no vitals filed for this visit.      Subjective Assessment - 03/29/16 0810    Subjective Pt reports he feels like he is turning a corner and his knee is really starting to feel like his knee is etting back to normal.   Patient Stated Goals "develop more flexibility and get back to normal walking pattern"   Currently in Pain? No/denies           Today's Treatment  TherEx  Rec bike - level 2 x 6'  Fitter B Hip extension & abduction (1 black/1 blue) x15 each  TRX squat x15 TRX squat + Heel raises x15 BATCA Knee flexion 35# x10 B concentric/eccentric, x10 B con/ R ecc BATCA Knee extension 25# x10 B concentric/eccentric, 20# x10 B con/ R ecc R Fwd step-up to BOSU (up) x15, intermittent 1 pole A Functional Squat on BOSU (inverted) with 6# db held at 90 dg shoulder to promote upright posture and proper weight shift x10 Manual R HS, HS + Gastroc, Glute & SKTC stretches x 30" each           PT Short Term Goals - 03/15/16 0948      PT SHORT TERM GOAL #1   Title Pt will be independent with initial HEP by 03/16/16   Status Achieved           PT Long Term Goals - 03/24/16 0852      PT LONG TERM GOAL #1   Title Pt will be independent with  advanced HEP/gym program by 04/20/16   Status On-going     PT LONG TERM GOAL #2   Title R knee ROM 3-125 or greater for normal gait and stair mechanics by 04/20/16   Status On-going     PT LONG TERM GOAL #3   Title R knee and B hip strength >/= 4+/5 by 04/20/16   Status On-going     PT LONG TERM GOAL #4   Title Pt will ambulate with normal gait pattern on all surfaces by 04/20/16   Status Partially Met  Able to demostrate normal gait pattern, but continues to require conscious effort     PT LONG TERM GOAL #5   Title Pt will negotiate stairs with normal reciprocal pattern by 04/20/16   Status Partially Met  Able to ascend with symmetrical pattern with cues for correct placement of SPC. Improving reciprocal descent but continues to lack eccentric control on R.               Plan - 03/29/16 0827    Clinical Impression Statement Pt reporting sense that knee is beginning to feel more normal and has reduced reliance  on knee brace. Continues to tolerate progression of therapeutic exercises with increasing resistance and progression to unstable surfaces.    PT Treatment/Interventions Patient/family education;Therapeutic exercise;Manual techniques;Passive range of motion;Taping;Neuromuscular re-education;Balance training;Electrical Stimulation;Cryotherapy;Vasopneumatic Device;Iontophoresis 73m/ml Dexamethasone;Gait training;Stair training;Therapeutic activities;ADLs/Self Care Home Management   PT Next Visit Plan R knee ROM & strengthening; B hip/core strengthening; Manual therapy and Modalities PRN for edema and pain management      Patient will benefit from skilled therapeutic intervention in order to improve the following deficits and impairments:  Pain, Impaired flexibility, Decreased range of motion, Decreased strength, Difficulty walking, Abnormal gait, Decreased activity tolerance, Increased edema  Visit Diagnosis: Stiffness of right knee, not elsewhere classified  Pain in right  knee  Difficulty in walking, not elsewhere classified  Other abnormalities of gait and mobility     Problem List Patient Active Problem List   Diagnosis Date Noted  . Thumb pain 12/12/2015  . Mild cognitive disorder 04/17/2014  . Anxiety, generalized 03/20/2014    JPercival Spanish PT, MPT 03/29/2016, 9:42 AM  [facility] [place] [address]Phone: [phone]  Fax:  [phone] Name: Darryl FUKUDAMRN: [id number] of Birth: 5September 10, 1957

## 2016-03-31 ENCOUNTER — Ambulatory Visit: Payer: 59 | Admitting: Physical Therapy

## 2016-03-31 DIAGNOSIS — R262 Difficulty in walking, not elsewhere classified: Secondary | ICD-10-CM

## 2016-03-31 DIAGNOSIS — M25561 Pain in right knee: Secondary | ICD-10-CM

## 2016-03-31 DIAGNOSIS — R2689 Other abnormalities of gait and mobility: Secondary | ICD-10-CM

## 2016-03-31 DIAGNOSIS — M25661 Stiffness of right knee, not elsewhere classified: Secondary | ICD-10-CM

## 2016-03-31 NOTE — Therapy (Signed)
[facility] [address]  [place], [place], [address] Phone: [phone]   Fax:  [phone]  Physical Therapy Treatment  Patient Details  Name: Darryl French MRN: [id number] Date of Birth: 1956-06-23 Referring Provider: Joni Fears, MD  Encounter Date: 03/31/2016      PT End of Session - 03/31/16 0800    Visit Number 9   Number of Visits 12   Date for PT Re-Evaluation 04/20/16   PT Start Time 0800   PT Stop Time 0848   PT Time Calculation (min) 48 min   Activity Tolerance Patient tolerated treatment well   Behavior During Therapy [facility] for tasks assessed/performed      No past medical history on file.  No past surgical history on file.  There were no vitals filed for this visit.      Subjective Assessment - 03/31/16 0800    Subjective Pt reporting no pain and stiffness is also improving.   Patient Stated Goals "develop more flexibility and get back to normal walking pattern"   Currently in Pain? No/denies            [facility] PT Assessment - 03/31/16 0800      Observation/Other Assessments   Focus on Therapeutic Outcomes (FOTO)  Knee 45% (55% limitation)     AROM   AROM Assessment Site Knee   Right/Left Knee Right   Right Knee Extension 2   Right Knee Flexion 121     PROM   PROM Assessment Site Knee   Right/Left Knee Right   Right Knee Extension 0   Right Knee Flexion 127     Strength   Right Hip Flexion 4+/5   Right Hip Extension 4/5   Right Hip ABduction 4+/5   Right Hip ADduction 4-/5   Right Knee Flexion 4+/5   Right Knee Extension 4/5         Today's Treatment  TherEx  Rec bike - level 2 x 6'  Fitter B Hip extension & abduction (1 black/1 blue) x15 each  Standing B 4-way SLR with green TB standing with opposite foot on blue foam oval x10 each, intermittent 1 pole A B Doorway lunge x10  (close monitoring for proper alignment and technique) Sidestepping in partial squat  with green TB around ankles x 49f Monster walk fwd with green TB 2 x 246fManual R HS, HS + Gastroc, Glute & SKTC stretches x 30" each          PT Education - 03/31/16 0847    Education provided Yes   Education Details Sidestepping and monster walk with green TB added to HEP, 4 way SLR advanced to green TB for HEP   Person(s) Educated Patient   Methods Explanation;Demonstration   Comprehension Verbalized understanding;Returned demonstration;Need further instruction          PT Short Term Goals - 03/15/16 0948      PT SHORT TERM GOAL #1   Title Pt will be independent with initial HEP by 03/16/16   Status Achieved           PT Long Term Goals - 03/31/16 0848      PT LONG TERM GOAL #1   Title Pt will be independent with advanced HEP/gym program by 04/20/16   Status On-going     PT LONG TERM GOAL #2   Title R knee ROM 3-125 or greater for normal gait and stair mechanics by 04/20/16   Status Partially  Met  Currently 2-121; Met for extension ROM     PT LONG TERM GOAL #3   Title R knee and B hip strength >/= 4+/5 by 04/20/16   Status Partially Met  Met for R knee flexion, hip flexion & abduction     PT LONG TERM GOAL #4   Title Pt will ambulate with normal gait pattern on all surfaces by 04/20/16   Status Achieved     PT LONG TERM GOAL #5   Title Pt will negotiate stairs with normal reciprocal pattern by 04/20/16   Status Partially Met  Able to ascend with symmetrical pattern with cues for correct placement of SPC. Improving reciprocal descent but continues to lack eccentric control on R.               Plan - 03/31/16 0819    Clinical Impression Statement Pt reports selective completion of HEP, completing exercises that he "likes" and less consistent with other exercises. R knee extension AROM continues to improve with pt now only with 2 dg quad lag. R LE strength improved by grossly 1/2 grade with pt remaining weaknest in hip extension and adduction.   PT  Treatment/Interventions Patient/family education;Therapeutic exercise;Manual techniques;Passive range of motion;Taping;Neuromuscular re-education;Balance training;Electrical Stimulation;Cryotherapy;Vasopneumatic Device;Iontophoresis 26m/ml Dexamethasone;Gait training;Stair training;Therapeutic activities;ADLs/Self Care Home Management   PT Next Visit Plan R knee ROM & strengthening; B hip/core strengthening; Manual therapy and Modalities PRN for edema and pain management      Patient will benefit from skilled therapeutic intervention in order to improve the following deficits and impairments:  Pain, Impaired flexibility, Decreased range of motion, Decreased strength, Difficulty walking, Abnormal gait, Decreased activity tolerance, Increased edema  Visit Diagnosis: Stiffness of right knee, not elsewhere classified  Pain in right knee  Difficulty in walking, not elsewhere classified  Other abnormalities of gait and mobility     Problem List Patient Active Problem List   Diagnosis Date Noted  . Thumb pain 12/12/2015  . Mild cognitive disorder 04/17/2014  . Anxiety, generalized 03/20/2014    JPercival Spanish PT, MPT 03/31/2016, 9:11 AM  [facility] [place] [address]Phone: [phone]  Fax:  [phone] Name: Darryl TIEMANNMRN: [id number] of Birth: [birth year]

## 2016-04-05 ENCOUNTER — Ambulatory Visit: Payer: 59

## 2016-04-05 DIAGNOSIS — R262 Difficulty in walking, not elsewhere classified: Secondary | ICD-10-CM

## 2016-04-05 DIAGNOSIS — R2689 Other abnormalities of gait and mobility: Secondary | ICD-10-CM

## 2016-04-05 DIAGNOSIS — M25661 Stiffness of right knee, not elsewhere classified: Secondary | ICD-10-CM | POA: Diagnosis not present

## 2016-04-05 DIAGNOSIS — M25561 Pain in right knee: Secondary | ICD-10-CM

## 2016-04-05 NOTE — Therapy (Signed)
[facility] [address]  [place], [place], [address] Phone: [phone]   Fax:  [phone]  Physical Therapy Treatment  Patient Details  Name: Darryl French MRN: [id number] Date of Birth: [birth year] Referring Provider: Joni Fears, MD  Encounter Date: 04/05/2016      PT End of Session - 04/05/16 0811    Visit Number 10   Number of Visits 12   Date for PT Re-Evaluation 04/20/16   PT Start Time 0800   PT Stop Time 0840   PT Time Calculation (min) 40 min   Activity Tolerance Patient tolerated treatment well   Behavior During Therapy [facility] for tasks assessed/performed      No past medical history on file.  No past surgical history on file.  There were no vitals filed for this visit.      Subjective Assessment - 04/05/16 0810    Subjective Pt. reports he feels that the progress at the knee has slowed as of late however thinks this is due to his arthritis flaring up.     Patient Stated Goals "develop more flexibility and get back to normal walking pattern"   Currently in Pain? No/denies   Multiple Pain Sites No            [facility] PT Assessment - 04/05/16 0905      Assessment   Medical Diagnosis R knee scope    Referring Provider Joni Fears, MD   Next MD Visit 04/07/16     Observation/Other Assessments   Focus on Therapeutic Outcomes (FOTO)  knee 47% (53% limitation)       Today's Treatment:  Therex: NuStep: level 6, 5 min  Supine R SLR 3# x 15 reps  Side stepping with green TB x 50 ft Monster walk with green TB x 50 ft   Stair navigation: Able to ascend with symmetrical pattern with single rail use and no AD.  Improving reciprocal descent but continues to lack eccentric control on R.   Goal testing   ROM testing   MMT testing        PT Short Term Goals - 03/15/16 0948      PT SHORT TERM GOAL #1   Title Pt will be independent with initial HEP by 03/16/16   Status Achieved            PT Long Term Goals - 04/05/16 0813      PT LONG TERM GOAL #1   Title Pt will be independent with advanced HEP/gym program by 04/20/16   Status On-going     PT LONG TERM GOAL #2   Title R knee ROM 3-125 or greater for normal gait and stair mechanics by 04/20/16   Status Partially Met  04/05/16: 1-121; Met for extension ROM     PT LONG TERM GOAL #3   Title R knee and B hip strength >/= 4+/5 by 04/20/16   Status Partially Met  04/05/16: met with exception of B hip extension, and L hip adduction.       PT LONG TERM GOAL #4   Title Pt will ambulate with normal gait pattern on all surfaces by 04/20/16   Status Achieved     PT LONG TERM GOAL #5   Title Pt will negotiate stairs with normal reciprocal pattern by 04/20/16   Status Partially Met  04/05/16: Able to ascend with symmetrical pattern with single rail use and no AD.  Improving reciprocal descent but continues to  lack eccentric control on R.               Plan - 04/05/16 8159    Clinical Impression Statement Pt. reports he feels that the progress at the knee has slowed as of late however thinks this is due to his arthritis flaring up.  Pt. with no pain initially today and only mild R knee pain while descending stairs.  Pt. able to meet B hip/knee strength goal with exception of L hip adduction and B hip extension 4/5 currently.  Pt. able to demo 1-121 dg R knee AROM and ability to ascend/descend stairs with light rail use and only mild instability at R knee noted with descending.  Pt. progressing toward established goals well and has 2 more treatments in POC.     PT Treatment/Interventions Patient/family education;Therapeutic exercise;Manual techniques;Passive range of motion;Taping;Neuromuscular re-education;Balance training;Electrical Stimulation;Cryotherapy;Vasopneumatic Device;Iontophoresis 31m/ml Dexamethasone;Gait training;Stair training;Therapeutic activities;ADLs/Self Care Home Management   PT Next Visit Plan R knee  ROM & strengthening; B hip/core strengthening; Manual therapy and Modalities PRN for edema and pain management      Patient will benefit from skilled therapeutic intervention in order to improve the following deficits and impairments:  Pain, Impaired flexibility, Decreased range of motion, Decreased strength, Difficulty walking, Abnormal gait, Decreased activity tolerance, Increased edema  Visit Diagnosis: Stiffness of right knee, not elsewhere classified  Pain in right knee  Difficulty in walking, not elsewhere classified  Other abnormalities of gait and mobility     Problem List Patient Active Problem List   Diagnosis Date Noted  . Thumb pain 12/12/2015  . Mild cognitive disorder 04/17/2014  . Anxiety, generalized 03/20/2014    MBess Harvest PTA 04/05/2016, 12:43 PM  [facility] [place] [address]Phone: [phone]  Fax:  [phone] Name: Darryl ZEIMETMRN: [id number] of Birth: [birth year]

## 2016-04-07 ENCOUNTER — Ambulatory Visit: Payer: 59 | Attending: Orthopedic Surgery

## 2016-04-07 DIAGNOSIS — R2689 Other abnormalities of gait and mobility: Secondary | ICD-10-CM | POA: Diagnosis present

## 2016-04-07 DIAGNOSIS — M25561 Pain in right knee: Secondary | ICD-10-CM | POA: Insufficient documentation

## 2016-04-07 DIAGNOSIS — R262 Difficulty in walking, not elsewhere classified: Secondary | ICD-10-CM | POA: Insufficient documentation

## 2016-04-07 DIAGNOSIS — M25661 Stiffness of right knee, not elsewhere classified: Secondary | ICD-10-CM | POA: Diagnosis not present

## 2016-04-07 NOTE — Therapy (Addendum)
[facility] [address]  [place], [place], [address] Phone: [phone]   Fax:  [phone]  Physical Therapy Treatment  Patient Details  Name: Darryl French MRN: [id number] Date of Birth: 06/06/1956 Referring Provider: Joni Fears, MD  Encounter Date: 04/07/2016      PT End of Session - 04/07/16 0851    Visit Number 11   Number of Visits 12   Date for PT Re-Evaluation 04/20/16   PT Start Time 0803   PT Stop Time 0847   PT Time Calculation (min) 44 min   Activity Tolerance Patient tolerated treatment well   Behavior During Therapy [facility] for tasks assessed/performed      No past medical history on file.  No past surgical history on file.  There were no vitals filed for this visit.      Subjective Assessment - 04/07/16 0807    Subjective Pt. reports he has stiffness and fatigue initially today however is pain free in the R knee; pt. reports he is stiff and fatigued often at the R LE however pt. revealed that he was taking a two flight set of stairs five times a day at work.     Patient Stated Goals "develop more flexibility and get back to normal walking pattern"   Currently in Pain? No/denies   Pain Score 0-No pain   Multiple Pain Sites No      Today's treatment:  Therex: Recumbent bike: level 1, 7 min; due to pt. reporting increased stiffness R HS, glute, piri, SKTC x 30 sec  Forward monster walk with green TB x 50  Backward monster walk with green TB x 50 ft  Side stepping with green TB x 50 ft  Hooklying sustained bridge with hip abd/ER with blue TB 3 x 5 reps each side  Hooklying bridge with adduction ball squeeze x 10 reps  Single leg bride x 10 reps each side         PT Education - 04/07/16 0850    Education provided Yes   Education Details counter squat, bridge, and bridge variations, monster walk, side step with band, pt. instructed to use green looped band at home    Person(s)  Educated Patient   Methods Handout;Explanation;Verbal cues   Comprehension Verbal cues required;Verbalized understanding          PT Short Term Goals - 03/15/16 0948      PT SHORT TERM GOAL #1   Title Pt will be independent with initial HEP by 03/16/16   Status Achieved           PT Long Term Goals - 04/07/16 1555      PT LONG TERM GOAL #1   Title Pt will be independent with advanced HEP/gym program by 04/20/16   Status Achieved     PT LONG TERM GOAL #2   Title R knee ROM 3-125 or greater for normal gait and stair mechanics by 04/20/16   Status Partially Met  04/05/16: 1-121; Met for extension ROM     PT LONG TERM GOAL #3   Title R knee and B hip strength >/= 4+/5 by 04/20/16   Status Partially Met  04/05/16: met with exception of B hip extension, and L hip adduction.       PT LONG TERM GOAL #4   Title Pt will ambulate with normal gait pattern on all surfaces by 04/20/16   Status Achieved     PT LONG TERM  GOAL #5   Title Pt will negotiate stairs with normal reciprocal pattern by 04/20/16   Status Partially Met  04/05/16: Able to ascend with symmetrical pattern with single rail use and no AD.  Improving reciprocal descent but continues to lack eccentric control on R.               Plan - 04/07/16 0811    Clinical Impression Statement Pt. reports he has stiffness and fatigue initially today however is pain free in the R knee; pt. reports he is stiff and fatigued often at the R LE however pt. revealed that he was taking a two flight set of stairs five times a day at work.  Pt. instructed to only take the two flights of stairs at work a few times each day and use the elevator more until the R knee can adjust.  Today's treatment focused on updating and consolidation of HEP.  Pt. issued a handout of updated HEP with bridge and bridge variations added today.  Pt. instructed to perform upadated HEP and inform PT next treatment of need for changes.  Pt. last treatment in POC next  treatment.     PT Next Visit Plan FOTO; visit 12/12      Patient will benefit from skilled therapeutic intervention in order to improve the following deficits and impairments:     Visit Diagnosis: Stiffness of right knee, not elsewhere classified  Pain in right knee  Difficulty in walking, not elsewhere classified  Other abnormalities of gait and mobility     Problem List Patient Active Problem List   Diagnosis Date Noted  . Thumb pain 12/12/2015  . Mild cognitive disorder 04/17/2014  . Anxiety, generalized 03/20/2014    Bess Harvest, PTA 04/07/2016, 3:56 PM  [facility] [address]  [place], [place], [address] Phone: [phone]   Fax:  [phone]  Name: Darryl French MRN: [id number] Date of Birth: 14-Nov-1955   PHYSICAL THERAPY DISCHARGE SUMMARY  Visits from Start of Care: 11  Current functional level related to goals / functional outcomes:    Pt released from PT by MD without returning for final assessment. As of last treatment visit, all goals were met or partially met.   Remaining deficits:   As above. Pt to continue on own with HEP.   Education / Equipment:   HEP  Plan: Patient agrees to discharge.  Patient goals were partially met. Patient is being discharged due to the physician's request.  ?????    Percival Spanish, PT, MPT 04/26/16, 1:29 PM  [facility] [address]  [place], [place], [address] Phone: [phone]   Fax:  [phone]

## 2016-04-12 ENCOUNTER — Ambulatory Visit: Payer: 59 | Admitting: Physical Therapy

## 2016-04-14 ENCOUNTER — Ambulatory Visit: Payer: 59 | Admitting: Physical Therapy

## 2017-04-07 IMAGING — MR MR KNEE*R* W/O CM
4 of 5 series · 20 of 40 positions shown · non-contrast
Comparison: None.

CLINICAL DATA: Anterior knee pain after twisting injury.

EXAM:
MRI OF THE RIGHT KNEE WITHOUT CONTRAST
TECHNIQUE: Multiplanar, multisequence MR imaging of the knee was performed. No
intravenous contrast was administered.

[Series 3: pd_tse_fs_tra · axial · 4.0mm · 0.42mm/px · z∈[-21,+56]mm · 3 of 24 slices shown]
[im 4/24]
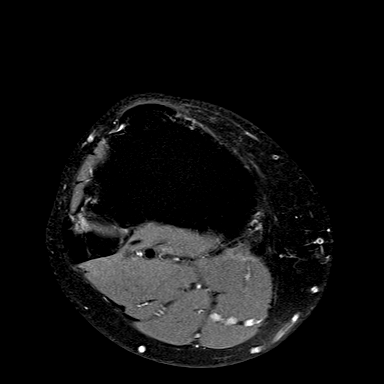
[im 14/24]
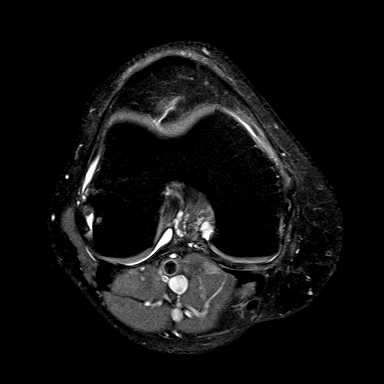
[im 20/24]
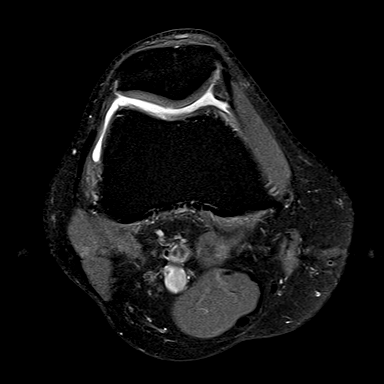

[Series 5: T2 fat-sat · coronal · 3.2mm · 0.62mm/px · 6 of 26 slices shown]
[im 1/26]
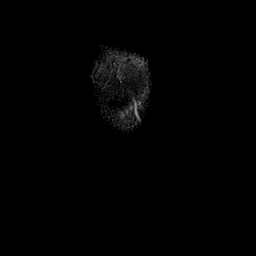
[im 4/26]
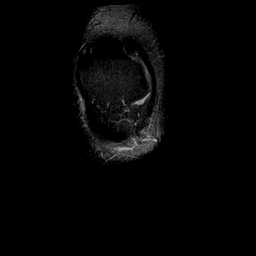
[im 8/26]
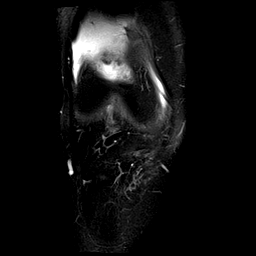
[im 11/26]
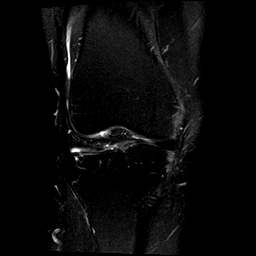
[im 15/26]
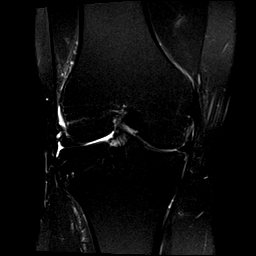
[im 22/26]
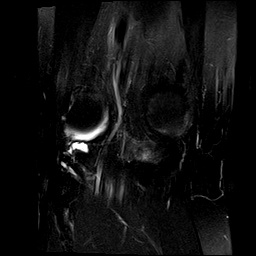

[Series 6: T1 · coronal · 3.2mm · 0.25mm/px · 3 of 26 slices shown]
[im 4/26]
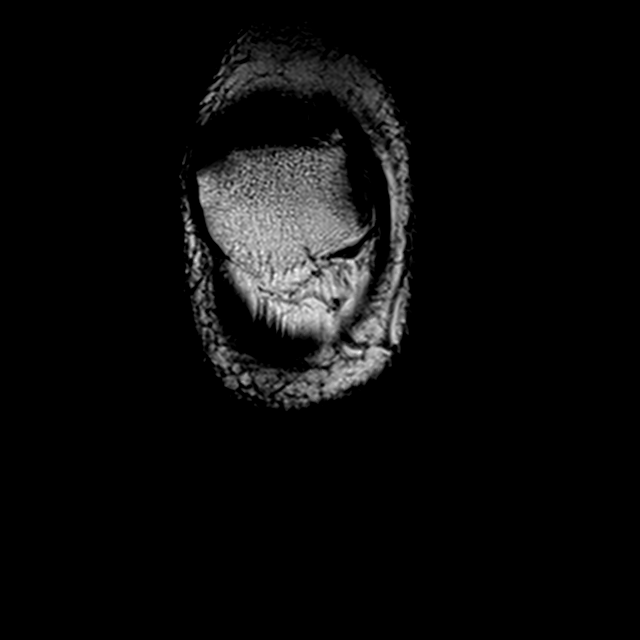
[im 15/26]
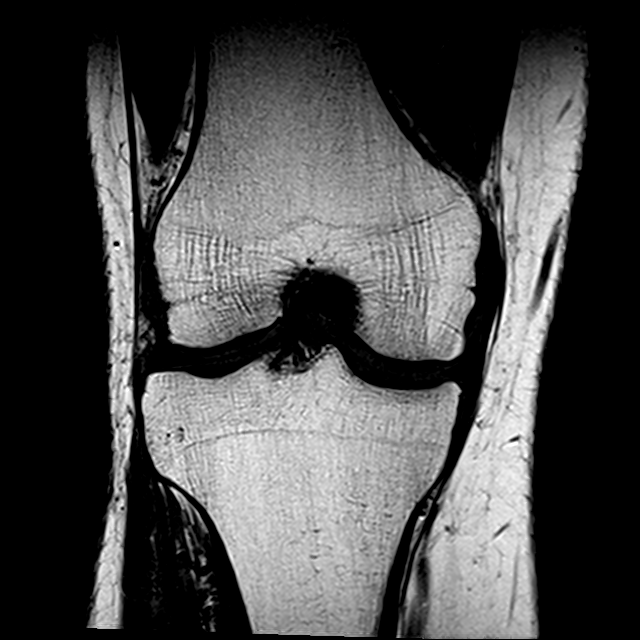
[im 22/26]
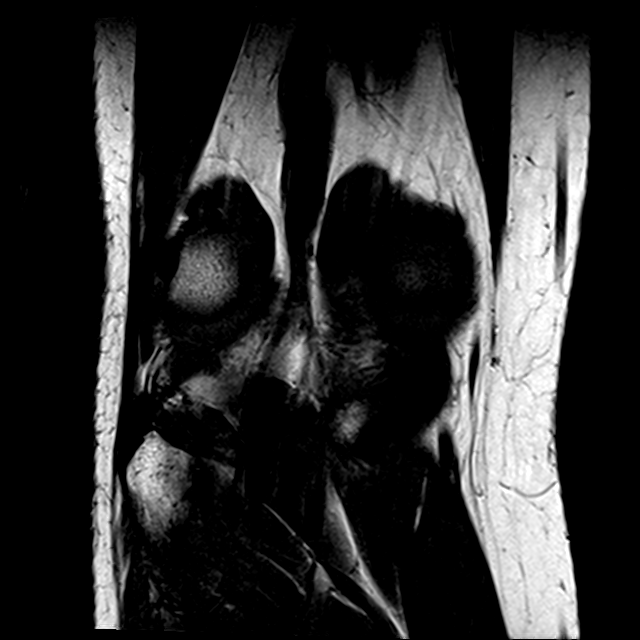

[Series 7: PD fat-sat · sagittal · 3.5mm · 0.25mm/px · 8 of 25 slices shown]
[im 1/25]
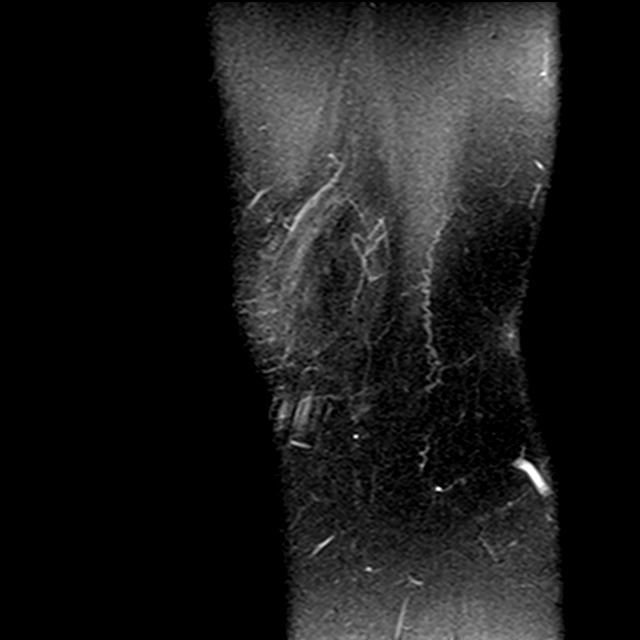
[im 4/25]
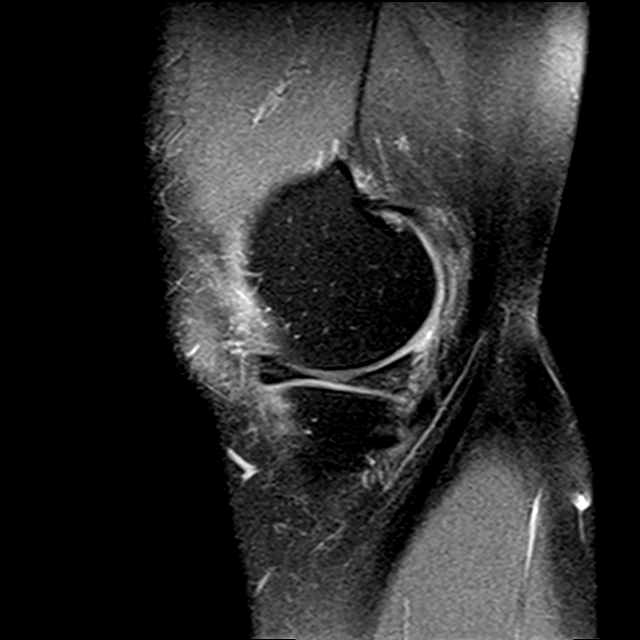
[im 7/25]
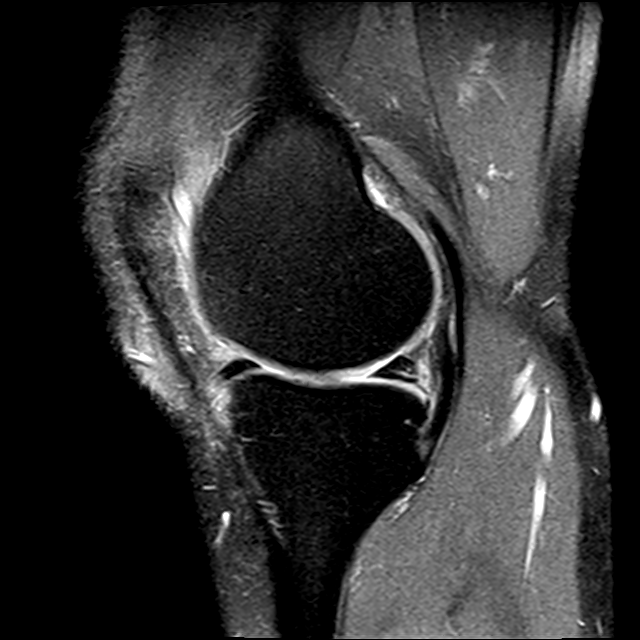
[im 11/25]
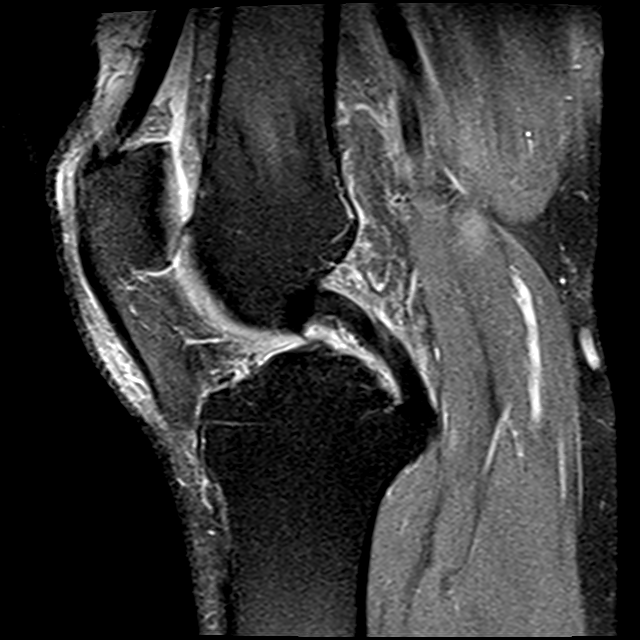
[im 14/25]
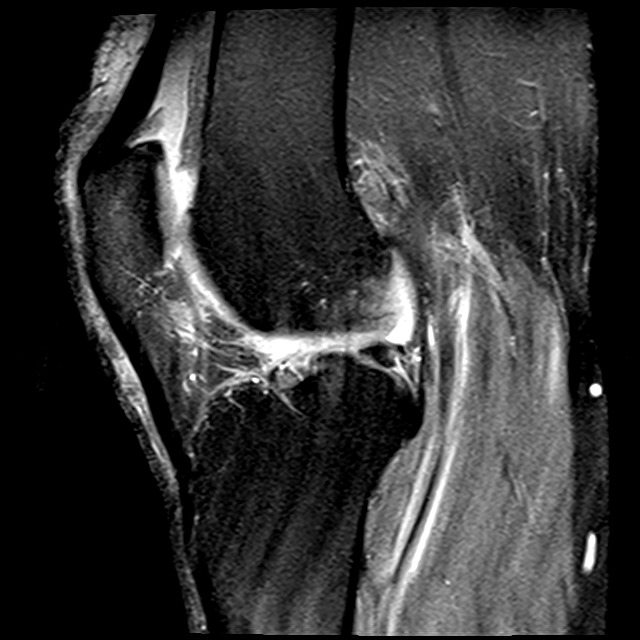
[im 18/25]
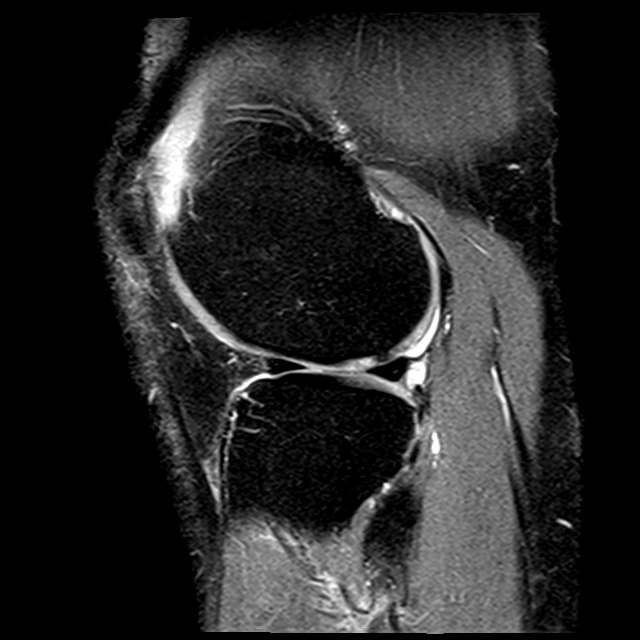
[im 21/25]
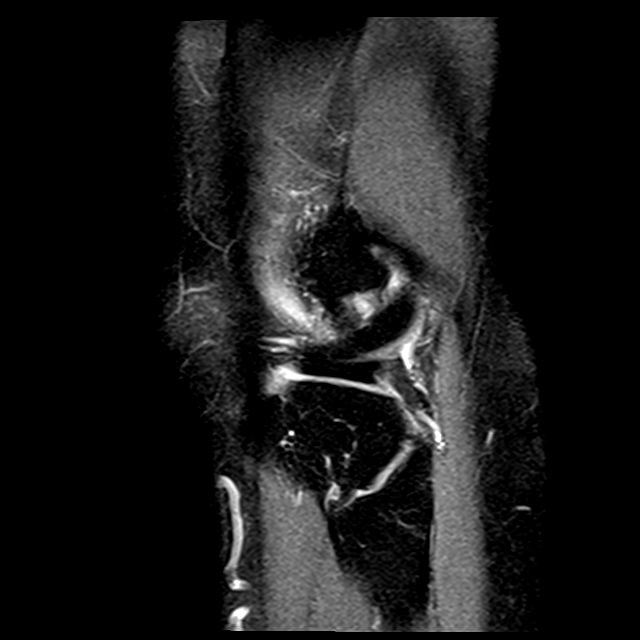
[im 25/25]
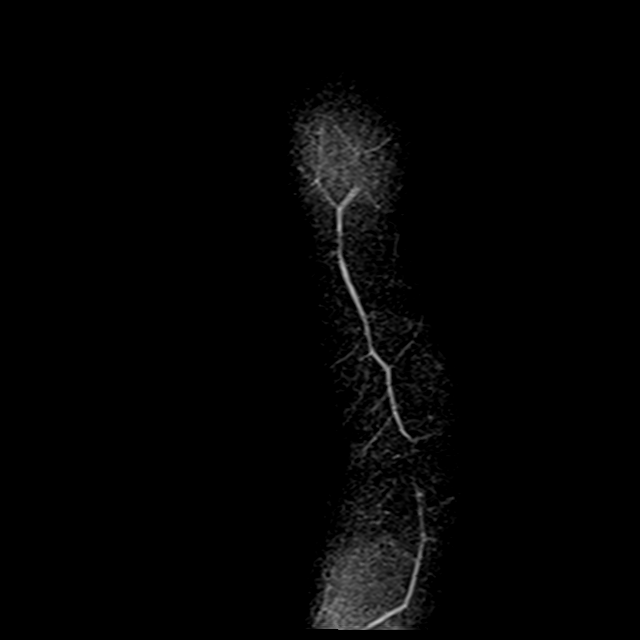

[20 of 40 positions shown; findings below may reference images not displayed]

FINDINGS: MENISCI

Medial meniscus: Radial tear of the root of the posterior horn with
adjacent grade 1 signal in the posterior horn.

Lateral meniscus:  Unremarkable

LIGAMENTS

Cruciates:  Unremarkable

Collaterals: Mildly thickened proximal MCL, images 9-12 series 3.
Faintly increased internal signal.

CARTILAGE

Patellofemoral:  Unremarkable

Medial: Mild chondral fissuring along the medial femoral condyle,
images 8 through 11 series 4, with mild chondral thinning.

Lateral: Focal chondral thinning and chondral irregularity with an
articular spur of the posterior face of the lateral femoral condyle,
images 6 through 9 series 5.

Joint:  Moderate knee effusion.

Popliteal Fossa:  Unremarkable

Extensor Mechanism:  Unremarkable

Bones:  Unremarkable
IMPRESSION: 1. Radial tear posterior horn medial meniscus at the meniscal root.
2. Mild grade 2 sprain of the proximal MCL.
3. Moderate knee effusion.
4. Mild chondral fissuring along the medial femoral condyle
articular surface.
5. Focal chondral thinning along an articular surface spur of the
posterior portion of the lateral femoral condyle.

## 2019-05-08 ENCOUNTER — Telehealth: Payer: Self-pay | Admitting: Adult Health

## 2019-05-08 NOTE — Telephone Encounter (Signed)
Patient called and said that he needs a letter stating that he has a service dog that needs to stay with him on his vacation. He needs the letter before 9/7

## 2019-05-17 ENCOUNTER — Ambulatory Visit: Payer: BC Managed Care – PPO | Admitting: Adult Health

## 2019-05-31 ENCOUNTER — Ambulatory Visit (INDEPENDENT_AMBULATORY_CARE_PROVIDER_SITE_OTHER): Payer: BC Managed Care – PPO | Admitting: Adult Health

## 2019-05-31 ENCOUNTER — Encounter: Payer: Self-pay | Admitting: Adult Health

## 2019-05-31 ENCOUNTER — Other Ambulatory Visit: Payer: Self-pay

## 2019-05-31 DIAGNOSIS — F429 Obsessive-compulsive disorder, unspecified: Secondary | ICD-10-CM | POA: Diagnosis not present

## 2019-05-31 DIAGNOSIS — F411 Generalized anxiety disorder: Secondary | ICD-10-CM

## 2019-05-31 DIAGNOSIS — F331 Major depressive disorder, recurrent, moderate: Secondary | ICD-10-CM

## 2019-05-31 NOTE — Progress Notes (Signed)
Darryl French [id number] 12-Dec-1955 63 y.o.  Subjective:   Patient ID:  Darryl French is a 63 y.o. (DOB 1956-05-18) male.  Chief Complaint: No chief complaint on file.   HPI Darryl French presents to the office today for follow-up of anxiety, depression, and OCD.   Describes mood today as "ok". Pleasant. Mood symptoms - denies depression, anxiety, and irritability. Stopped all medications "cold [place]" back in July. Stating "I probably shouldn't have done that". Started reading about the side effects and didn't feel like he should continue. Had "3 weeks" of feeling bad. Feels more irritable "quick tempered". Not as focused as he was. Also stating "I'm retired now and I don't think I need al these meds". He and wife looking [place] "[place] home".Stable interest and motivation. Taking medications as prescribed.  Energy levels stable. Active, does not have a regular exercise routine. Retired. Working outdoors.  Enjoys some usual interests and activities. Lives with wife. Spending time with family - daughter and grandchildren. Appetite adequate. Weight stable. Sleeps well most nights. Averages 6 to 8 hours. Focus and concentration stable. Completing tasks. Managing aspects of household.  Denies SI or HI. Denies AH or VH.  Review of systems: Review of Systems  Musculoskeletal: Negative for gait problem.  Neurological: Negative for tremors.  Psychiatric/Behavioral:       Please refer to HPI    Medications: I have reviewed the patient's current medications.  No current outpatient medications on file.   No current facility-administered medications for this visit.     Medication Side Effects: None  Allergies:  Allergies  Allergen Reactions  . Sulfa Antibiotics     No past medical history on file.  No family history on file.  Social History   Socioeconomic History  . Marital status: Married    Spouse name: Not on file  . Number of children: Not on file  . Years of education: Not  on file  . Highest education level: Not on file  Occupational History  . Not on file  Social Needs  . Financial resource strain: Not on file  . Food insecurity    Worry: Not on file    Inability: Not on file  . Transportation needs    Medical: Not on file    Non-medical: Not on file  Tobacco Use  . Smoking status: Never Smoker  Substance and Sexual Activity  . Alcohol use: Not on file  . Drug use: Not on file  . Sexual activity: Not on file  Lifestyle  . Physical activity    Days per week: Not on file    Minutes per session: Not on file  . Stress: Not on file  Relationships  . Social Herbalist on phone: Not on file    Gets together: Not on file    Attends religious service: Not on file    Active member of club or organization: Not on file    Attends meetings of clubs or organizations: Not on file    Relationship status: Not on file  . Intimate partner violence    Fear of current or ex partner: Not on file    Emotionally abused: Not on file    Physically abused: Not on file    Forced sexual activity: Not on file  Other Topics Concern  . Not on file  Social History Narrative  . Not on file    Past Medical History, Surgical history, Social history, and Family history were  reviewed and updated as appropriate.   Please see review of systems for further details on the patient's review from today.   Objective:   Physical Exam:  There were no vitals taken for this visit.  Physical Exam Constitutional:      General: He is not in acute distress.    Appearance: He is well-developed.  Musculoskeletal:        General: No deformity.  Neurological:     Mental Status: He is alert and oriented to person, place, and time.     Coordination: Coordination normal.  Psychiatric:        Attention and Perception: Attention and perception normal. He does not perceive auditory or visual hallucinations.        Mood and Affect: Mood normal. Mood is not anxious or  depressed. Affect is not labile, blunt, angry or inappropriate.        Speech: Speech normal.        Behavior: Behavior normal.        Thought Content: Thought content normal. Thought content is not paranoid or delusional. Thought content does not include homicidal or suicidal ideation. Thought content does not include homicidal or suicidal plan.        Cognition and Memory: Cognition and memory normal.        Judgment: Judgment normal.     Comments: Insight intact     Lab Review:  No results found for: NA, K, CL, CO2, GLUCOSE, BUN, CREATININE, CALCIUM, PROT, ALBUMIN, AST, ALT, ALKPHOS, BILITOT, GFRNONAA, GFRAA  No results found for: WBC, RBC, HGB, HCT, PLT, MCV, MCH, MCHC, RDW, LYMPHSABS, MONOABS, EOSABS, BASOSABS  No results found for: POCLITH, LITHIUM   No results found for: PHENYTOIN, PHENOBARB, VALPROATE, CBMZ   .res Assessment: Plan:    Plan:  Does not wish to restart medications at this time.   RTC as needed.  Patient advised to contact office with any questions, adverse effects, or acute worsening in signs and symptoms.  Diagnoses and all orders for this visit:  Generalized anxiety disorder  Major depressive disorder, recurrent episode, moderate (HCC)  Obsessive-compulsive disorder, unspecified type     Please see After Visit Summary for patient specific instructions.  No future appointments.  No orders of the defined types were placed in this encounter.   -------------------------------

## 2019-11-28 ENCOUNTER — Encounter: Payer: Self-pay | Admitting: Adult Health

## 2019-11-28 ENCOUNTER — Ambulatory Visit: Payer: BC Managed Care – PPO | Admitting: Adult Health

## 2019-11-28 ENCOUNTER — Other Ambulatory Visit: Payer: Self-pay

## 2019-11-28 DIAGNOSIS — F411 Generalized anxiety disorder: Secondary | ICD-10-CM

## 2019-11-28 DIAGNOSIS — F331 Major depressive disorder, recurrent, moderate: Secondary | ICD-10-CM | POA: Diagnosis not present

## 2019-11-28 DIAGNOSIS — F429 Obsessive-compulsive disorder, unspecified: Secondary | ICD-10-CM | POA: Diagnosis not present

## 2019-11-28 NOTE — Progress Notes (Signed)
Darryl French [id number] 1956-06-19 65 y.o.  Subjective:   Patient ID:  Darryl French is a 64 y.o. (DOB 09-Jun-1956) male.  Chief Complaint: No chief complaint on file.   HPI Darryl French presents to the office today for follow-up of anxiety, depression, and OCD.   Describes mood today as "ok". Pleasant. Mood symptoms - denies depression and anxiety. Reports increased irritability. Wife wanted him to relay that he has been more "angry". Has been quick to get irritated. Can be "snappy" at times. Feels like symptoms started after stopping medications, but does not want to restart medications at this time. Stable interest and motivation. Taking medications as prescribed.  Energy levels stable. Active, does not have a regular exercise routine. Retired. Getting outside when weather permits.  Enjoys some usual interests and activities. Married. Lives with [name] of 40 years. One grown daughter. Spending time with family - daughter and grandchildren. Appetite adequate. Weight stable. Sleeps better some nights than others. Going to bed earlier and getting up early. Up and down during the night.  Averages 8 hours. Focus and concentration difficulties since stopping medications. Has a difficult time "staying on task". Stating "my memory is trashed" Completing tasks. Managing aspects of household.  Denies SI or HI. Denies AH or VH.   Review of Systems:  Review of Systems  Musculoskeletal: Negative for gait problem.  Neurological: Negative for tremors.  Psychiatric/Behavioral:       Please refer to HPI    Medications: I have reviewed the patient's current medications.  No current outpatient medications on file.   No current facility-administered medications for this visit.    Medication Side Effects: None  Allergies:  Allergies  Allergen Reactions  . Sulfa Antibiotics Shortness Of Breath    No past medical history on file.  No family history on file.  Social History    Socioeconomic History  . Marital status: Married    Spouse name: Not on file  . Number of children: Not on file  . Years of education: Not on file  . Highest education level: Not on file  Occupational History  . Not on file  Tobacco Use  . Smoking status: Never Smoker  . Smokeless tobacco: Never Used  Substance and Sexual Activity  . Alcohol use: Not on file  . Drug use: Not on file  . Sexual activity: Not on file  Other Topics Concern  . Not on file  Social History Narrative  . Not on file   Social Determinants of Health   Financial Resource Strain:   . Difficulty of Paying Living Expenses:   Food Insecurity:   . Worried About Programme researcher, broadcasting/film/video in the Last Year:   . Barista in the Last Year:   Transportation Needs:   . Freight forwarder (Medical):   Marland Kitchen Lack of Transportation (Non-Medical):   Physical Activity:   . Days of Exercise per Week:   . Minutes of Exercise per Session:   Stress:   . Feeling of Stress :   Social Connections:   . Frequency of Communication with Friends and Family:   . Frequency of Social Gatherings with Friends and Family:   . Attends Religious Services:   . Active Member of Clubs or Organizations:   . Attends Banker Meetings:   Marland Kitchen Marital Status:   Intimate Partner Violence:   . Fear of Current or Ex-Partner:   . Emotionally Abused:   Marland Kitchen Physically Abused:   .  Sexually Abused:     Past Medical History, Surgical history, Social history, and Family history were reviewed and updated as appropriate.   Please see review of systems for further details on the patient's review from today.   Objective:   Physical Exam:  There were no vitals taken for this visit.  Physical Exam Constitutional:      General: He is not in acute distress. Musculoskeletal:        General: No deformity.  Neurological:     Mental Status: He is alert and oriented to person, place, and time.     Coordination: Coordination normal.   Psychiatric:        Attention and Perception: Attention and perception normal. He does not perceive auditory or visual hallucinations.        Mood and Affect: Mood normal. Mood is not anxious or depressed. Affect is not labile, blunt, angry or inappropriate.        Speech: Speech normal.        Behavior: Behavior normal.        Thought Content: Thought content normal. Thought content is not paranoid or delusional. Thought content does not include homicidal or suicidal ideation. Thought content does not include homicidal or suicidal plan.        Cognition and Memory: Cognition and memory normal.        Judgment: Judgment normal.     Comments: Insight intact     Lab Review:  No results found for: NA, K, CL, CO2, GLUCOSE, BUN, CREATININE, CALCIUM, PROT, ALBUMIN, AST, ALT, ALKPHOS, BILITOT, GFRNONAA, GFRAA  No results found for: WBC, RBC, HGB, HCT, PLT, MCV, MCH, MCHC, RDW, LYMPHSABS, MONOABS, EOSABS, BASOSABS  No results found for: POCLITH, LITHIUM   No results found for: PHENYTOIN, PHENOBARB, VALPROATE, CBMZ   .res Assessment: Plan:    Plan:  Does not wish to restart medications at this time.   RTC 6 months  Patient advised to contact office with any questions, adverse effects, or acute worsening in signs and symptoms.   There are no diagnoses linked to this encounter.   Please see After Visit Summary for patient specific instructions.  No future appointments.  No orders of the defined types were placed in this encounter.   -------------------------------

## 2020-05-21 ENCOUNTER — Other Ambulatory Visit: Payer: Self-pay

## 2020-05-21 ENCOUNTER — Ambulatory Visit (INDEPENDENT_AMBULATORY_CARE_PROVIDER_SITE_OTHER): Payer: BC Managed Care – PPO | Admitting: Adult Health

## 2020-05-21 ENCOUNTER — Encounter: Payer: Self-pay | Admitting: Adult Health

## 2020-05-21 DIAGNOSIS — F331 Major depressive disorder, recurrent, moderate: Secondary | ICD-10-CM | POA: Diagnosis not present

## 2020-05-21 DIAGNOSIS — F411 Generalized anxiety disorder: Secondary | ICD-10-CM

## 2020-05-21 NOTE — Progress Notes (Signed)
[name] [id number] [birth year] 64 y.o.  Subjective:   Patient ID:  Darryl French is a 64 y.o. (DOB [birth year]) male.  Chief Complaint: No chief complaint on file.   HPI NELS MUNN presents to the office today for follow-up of anxiety, depression, and OCD.   Describes mood today as "ok". Pleasant. Mood symptoms - denies depression, irritability, and anxiety. Stating "I'm doing alright". Diagnosed with low vitamin B12 - had weekly injections of B12 for 4 weeks - now on oral supplements. Wife retiring after 42 years. Stable interest and motivation. Taking medications as prescribed.  Energy levels stable. Active, does not have a regular exercise routine. Enjoys some usual interests and activities. Married. Lives with wife [name] of 40 years. One grown daughter. Spending time with family - daughter and 2 grandchildren. Appetite adequate. Weight stable. Sleeps better some nights than others. Averages 8 hours. Focus and concentration better with B12. Completing tasks. Managing aspects of household. Retired. Denies SI or HI. Denies AH or VH.     Review of Systems:  Review of Systems  Musculoskeletal: Negative for gait problem.  Neurological: Negative for tremors.  Psychiatric/Behavioral:       Please refer to HPI    Medications: I have reviewed the patient's current medications.  No current outpatient medications on file.   No current facility-administered medications for this visit.    Medication Side Effects: None  Allergies:  Allergies  Allergen Reactions  . Sulfa Antibiotics Shortness Of Breath    No past medical history on file.  No family history on file.  Social History   Socioeconomic History  . Marital status: Married    Spouse name: Not on file  . Number of children: Not on file  . Years of education: Not on file  . Highest education level: Not on file  Occupational History  . Not on file  Tobacco Use  . Smoking status: Never Smoker  . Smokeless  tobacco: Never Used  Substance and Sexual Activity  . Alcohol use: Not on file  . Drug use: Not on file  . Sexual activity: Not on file  Other Topics Concern  . Not on file  Social History Narrative  . Not on file   Social Determinants of Health   Financial Resource Strain:   . Difficulty of Paying Living Expenses: Not on file  Food Insecurity:   . Worried About Programme researcher, broadcasting/film/video in the Last Year: Not on file  . Ran Out of Food in the Last Year: Not on file  Transportation Needs:   . Lack of Transportation (Medical): Not on file  . Lack of Transportation (Non-Medical): Not on file  Physical Activity:   . Days of Exercise per Week: Not on file  . Minutes of Exercise per Session: Not on file  Stress:   . Feeling of Stress : Not on file  Social Connections:   . Frequency of Communication with Friends and Family: Not on file  . Frequency of Social Gatherings with Friends and Family: Not on file  . Attends Religious Services: Not on file  . Active Member of Clubs or Organizations: Not on file  . Attends Banker Meetings: Not on file  . Marital Status: Not on file  Intimate Partner Violence:   . Fear of Current or Ex-Partner: Not on file  . Emotionally Abused: Not on file  . Physically Abused: Not on file  . Sexually Abused: Not on file    Past  Medical History, Surgical history, Social history, and Family history were reviewed and updated as appropriate.   Please see review of systems for further details on the patient's review from today.   Objective:   Physical Exam:  There were no vitals taken for this visit.  Physical Exam Constitutional:      General: He is not in acute distress. Musculoskeletal:        General: No deformity.  Neurological:     Mental Status: He is alert and oriented to person, place, and time.     Coordination: Coordination normal.  Psychiatric:        Attention and Perception: Attention and perception normal. He does not  perceive auditory or visual hallucinations.        Mood and Affect: Mood normal. Mood is not anxious or depressed. Affect is not labile, blunt, angry or inappropriate.        Speech: Speech normal.        Behavior: Behavior normal.        Thought Content: Thought content normal. Thought content is not paranoid or delusional. Thought content does not include homicidal or suicidal ideation. Thought content does not include homicidal or suicidal plan.        Cognition and Memory: Cognition and memory normal.        Judgment: Judgment normal.     Comments: Insight intact     Lab Review:  No results found for: NA, K, CL, CO2, GLUCOSE, BUN, CREATININE, CALCIUM, PROT, ALBUMIN, AST, ALT, ALKPHOS, BILITOT, GFRNONAA, GFRAA  No results found for: WBC, RBC, HGB, HCT, PLT, MCV, MCH, MCHC, RDW, LYMPHSABS, MONOABS, EOSABS, BASOSABS  No results found for: POCLITH, LITHIUM   No results found for: PHENYTOIN, PHENOBARB, VALPROATE, CBMZ   .res Assessment: Plan:    Plan:  Does not wish to restart medications at this time.   RTC 6 months  Patient advised to contact office with any questions, adverse effects, or acute worsening in signs and symptoms.   Diagnoses and all orders for this visit:  Major depressive disorder, recurrent episode, moderate (HCC)  Generalized anxiety disorder     Please see After Visit Summary for patient specific instructions.  No future appointments.  No orders of the defined types were placed in this encounter.   -------------------------------

## 2021-01-22 ENCOUNTER — Ambulatory Visit (INDEPENDENT_AMBULATORY_CARE_PROVIDER_SITE_OTHER): Payer: BC Managed Care – PPO | Admitting: Adult Health

## 2021-01-22 ENCOUNTER — Encounter: Payer: Self-pay | Admitting: Adult Health

## 2021-01-22 ENCOUNTER — Other Ambulatory Visit: Payer: Self-pay

## 2021-01-22 DIAGNOSIS — F331 Major depressive disorder, recurrent, moderate: Secondary | ICD-10-CM

## 2021-01-22 DIAGNOSIS — F411 Generalized anxiety disorder: Secondary | ICD-10-CM | POA: Diagnosis not present

## 2021-01-22 NOTE — Progress Notes (Signed)
Darryl French [id number] 1956/01/22 65 y.o.  Subjective:   Patient ID:  Darryl French is a 65 y.o. (DOB 06-25-1956) male.  Chief Complaint: No chief complaint on file.   HPI Darryl French presents to the office today for follow-up of anxiety, depression, and OCD.   Describes mood today as "ok". Pleasant. Mood symptoms - reports some depression, irritability, and anxiety. Stating "I'm not doing as well as I was". Reports father passed away last 08-05-23 and has struggled since. He is the executor of the estate and is trying to get things in order. Stating "I can't seem to get motivated to do anything". Also stating "I know it has to be done, but I can't seem to do it". Wife is supportive and has been trying to help him Feels stuck and can't get out of it. Does not want to restart medications at this time. Stating "I have been doing fine without them". Is looking for some ideas to help move forward.  Energy levels stable. Active, does not have a regular exercise routine. Enjoys some usual interests and activities. Married. Lives with wife "Darryl French" of 40 years. One grown daughter. Spending time with family - daughter and 2 grandchildren. Appetite adequate. Weight stable. Sleeps better some nights than others. Averages 8 hours. Focus and concentration difficulities. Completing tasks. Managing aspects of household. Retired. Denies SI or HI.  Denies AH or VH.  Review of Systems:  Review of Systems  Musculoskeletal: Negative for gait problem.  Neurological: Negative for tremors.  Psychiatric/Behavioral:       Please refer to HPI    Medications: I have reviewed the patient's current medications.  No current outpatient medications on file.   No current facility-administered medications for this visit.    Medication Side Effects: None  Allergies:  Allergies  Allergen Reactions  . Sulfa Antibiotics Shortness Of Breath    No past medical history on file.  Past Medical History,  Surgical history, Social history, and Family history were reviewed and updated as appropriate.   Please see review of systems for further details on the patient's review from today.   Objective:   Physical Exam:  There were no vitals taken for this visit.  Physical Exam Constitutional:      General: He is not in acute distress. Musculoskeletal:        General: No deformity.  Neurological:     Mental Status: He is alert and oriented to person, place, and time.     Coordination: Coordination normal.  Psychiatric:        Attention and Perception: Attention and perception normal. He does not perceive auditory or visual hallucinations.        Mood and Affect: Mood normal. Mood is not anxious or depressed. Affect is not labile, blunt, angry or inappropriate.        Speech: Speech normal.        Behavior: Behavior normal.        Thought Content: Thought content normal. Thought content is not paranoid or delusional. Thought content does not include homicidal or suicidal ideation. Thought content does not include homicidal or suicidal plan.        Cognition and Memory: Cognition and memory normal.        Judgment: Judgment normal.     Comments: Insight intact     Lab Review:  No results found for: NA, K, CL, CO2, GLUCOSE, BUN, CREATININE, CALCIUM, PROT, ALBUMIN, AST, ALT, ALKPHOS, BILITOT, GFRNONAA, GFRAA  No results  found for: WBC, RBC, HGB, HCT, PLT, MCV, MCH, MCHC, RDW, LYMPHSABS, MONOABS, EOSABS, BASOSABS  No results found for: POCLITH, LITHIUM   No results found for: PHENYTOIN, PHENOBARB, VALPROATE, CBMZ   .res Assessment: Plan:    Plan:  Does not wish to restart medications at this time.   RTC 6 months or as needed  Patient advised to contact office with any questions, adverse effects, or acute worsening in signs and symptoms.  Diagnoses and all orders for this visit:  Generalized anxiety disorder  Major depressive disorder, recurrent episode, moderate (HCC)      Please see After Visit Summary for patient specific instructions.  No future appointments.  No orders of the defined types were placed in this encounter.   -------------------------------

## 2022-04-16 ENCOUNTER — Encounter: Payer: Self-pay | Admitting: Adult Health

## 2022-04-16 ENCOUNTER — Telehealth: Payer: Self-pay | Admitting: Adult Health

## 2022-04-16 ENCOUNTER — Other Ambulatory Visit: Payer: Self-pay

## 2022-04-16 ENCOUNTER — Ambulatory Visit (INDEPENDENT_AMBULATORY_CARE_PROVIDER_SITE_OTHER): Payer: Medicare HMO | Admitting: Adult Health

## 2022-04-16 DIAGNOSIS — F411 Generalized anxiety disorder: Secondary | ICD-10-CM

## 2022-04-16 DIAGNOSIS — F331 Major depressive disorder, recurrent, moderate: Secondary | ICD-10-CM | POA: Diagnosis not present

## 2022-04-16 MED ORDER — BUPROPION HCL ER (XL) 150 MG PO TB24: ORAL_TABLET | ORAL | 2 refills | Status: DC

## 2022-04-16 NOTE — Telephone Encounter (Signed)
Fabion was just seen this am by RM. He wants to remove his current pharmacy from his chart. It is:  [id number] - [place], [place] - [address] [facility] [address]  Phone:  [phone]  Fax:  [phone]    Please add the following pharmacy to his chart and please call in his medication into this new pharmacy. It is:  [facility], [address], [place], [place] [address]. Phone number is [phone].

## 2022-04-16 NOTE — Telephone Encounter (Signed)
Rx sent 

## 2022-04-16 NOTE — Progress Notes (Signed)
Darryl French [id number] 03-09-1956 66 y.o.  Subjective:   Patient ID:  Darryl French is a 66 y.o. (DOB 1956-08-21) male.  Chief Complaint: No chief complaint on file.   HPI Darryl French presents to the office today for follow-up of anxiety and depression.   Describes mood today as "not too good". Pleasant. Denies tearfulness. Mood symptoms - reports increased depression, irritability, and anxiety. Mood has declined. Stating "I'm not doing too good". Feels like he needs to restart medication. Stating "I can't get motivated". Continues to struggle with loss of parents. Wife is supportive - taking care of both of her parents. Decreased interest and motivation. Energy levels lower. Active, does not have a regular exercise routine. Enjoys some usual interests and activities. Married. Lives with wife "[name]". Daughter married - 2 grandchildren. Spending time with family Appetite adequate. Weight stable. Sleeps well most nights. Averages 8 or more hours. Focus and concentration difficulities. Completing tasks. Managing aspects of household. Retired. Denies SI or HI.  Denies AH or VH.   Review of Systems:  Review of Systems  Musculoskeletal:  Negative for gait problem.  Neurological:  Negative for tremors.  Psychiatric/Behavioral:         Please refer to HPI    Medications: I have reviewed the patient's current medications.  Current Outpatient Medications  Medication Sig Dispense Refill   buPROPion (WELLBUTRIN XL) 150 MG 24 hr tablet Take one tablet every morning for 7 days, then increase to two tablets. 60 tablet 2   No current facility-administered medications for this visit.    Medication Side Effects: None  Allergies:  Allergies  Allergen Reactions   Sulfa Antibiotics Shortness Of Breath    No past medical history on file.  Past Medical History, Surgical history, Social history, and Family history were reviewed and updated as appropriate.   Please see review of systems  for further details on the patient's review from today.   Objective:   Physical Exam:  There were no vitals taken for this visit.  Physical Exam Constitutional:      General: He is not in acute distress. Musculoskeletal:        General: No deformity.  Neurological:     Mental Status: He is alert and oriented to person, place, and time.     Coordination: Coordination normal.  Psychiatric:        Attention and Perception: Attention and perception normal. He does not perceive auditory or visual hallucinations.        Mood and Affect: Mood normal. Mood is not anxious or depressed. Affect is not labile, blunt, angry or inappropriate.        Speech: Speech normal.        Behavior: Behavior normal.        Thought Content: Thought content normal. Thought content is not paranoid or delusional. Thought content does not include homicidal or suicidal ideation. Thought content does not include homicidal or suicidal plan.        Cognition and Memory: Cognition and memory normal.        Judgment: Judgment normal.     Comments: Insight intact     Lab Review:  No results found for: "NA", "K", "CL", "CO2", "GLUCOSE", "BUN", "CREATININE", "CALCIUM", "PROT", "ALBUMIN", "AST", "ALT", "ALKPHOS", "BILITOT", "GFRNONAA", "GFRAA"  No results found for: "WBC", "RBC", "HGB", "HCT", "PLT", "MCV", "MCH", "MCHC", "RDW", "LYMPHSABS", "MONOABS", "EOSABS", "BASOSABS"  No results found for: "POCLITH", "LITHIUM"   No results found for: "PHENYTOIN", "PHENOBARB", "VALPROATE", "CBMZ"   .  res Assessment: Plan:    Plan:  Add Wellbutrin XL 150mg  daily x 7 days, then increase to 300mg  daily. Denies seizure history.  RTC 6 weeks  Patient advised to contact office with any questions, adverse effects, or acute worsening in signs and symptoms.  Time spent with patient was 25 minutes. Greater than 50% of face to face time with patient was spent on counseling and coordination of care.    Diagnoses and all orders for  this visit:  Major depressive disorder, recurrent episode, moderate (HCC) -     buPROPion (WELLBUTRIN XL) 150 MG 24 hr tablet; Take one tablet every morning for 7 days, then increase to two tablets.  Generalized anxiety disorder -     buPROPion (WELLBUTRIN XL) 150 MG 24 hr tablet; Take one tablet every morning for 7 days, then increase to two tablets.     Please see After Visit Summary for patient specific instructions.  No future appointments.   No orders of the defined types were placed in this encounter.   -------------------------------

## 2022-05-19 ENCOUNTER — Ambulatory Visit: Payer: Medicare HMO | Admitting: Adult Health

## 2022-05-19 ENCOUNTER — Encounter: Payer: Self-pay | Admitting: Adult Health

## 2022-05-19 DIAGNOSIS — F411 Generalized anxiety disorder: Secondary | ICD-10-CM

## 2022-05-19 DIAGNOSIS — F331 Major depressive disorder, recurrent, moderate: Secondary | ICD-10-CM | POA: Diagnosis not present

## 2022-05-19 NOTE — Progress Notes (Signed)
Darryl French [id number] [birth year] 66 y.o.  Subjective:   Patient ID:  Darryl French is a 66 y.o. (DOB 03-Nov-1955) male.  Chief Complaint: No chief complaint on file.   HPI Darryl French presents to the office today for follow-up of anxiety and depression.   Describes mood today as "not too good". Pleasant. Denies tearfulness. Mood symptoms - reports decreased depression, irritability and anxiety. Mood has improved. Stating "I'm feeling better". Feels like restarting the Wellbutrin has been very helpful. Reports ongoing situational stressors. Increased interest and motivation.  Energy levels improved - getting some things done around the house. Active, does not have a regular exercise routine. Enjoys some usual interests and activities. Married. Lives with wife "[name]". Daughter married - 2 grandchildren. Spending time with family Appetite adequate. Weight stable. Sleeps well most nights. Averages 8 or more hours. Napping some in the afternoons. Focus and concentration difficulities. Completing tasks. Managing aspects of household. Retired. Denies SI or HI.  Denies AH or VH.  Review of Systems:  Review of Systems  Musculoskeletal:  Negative for gait problem.  Neurological:  Negative for tremors.  Psychiatric/Behavioral:         Please refer to HPI    Medications: I have reviewed the patient's current medications.  Current Outpatient Medications  Medication Sig Dispense Refill   buPROPion (WELLBUTRIN XL) 150 MG 24 hr tablet Take one tablet every morning for 7 days, then increase to two tablets. 60 tablet 2   No current facility-administered medications for this visit.    Medication Side Effects: None  Allergies:  Allergies  Allergen Reactions   Sulfa Antibiotics Shortness Of Breath    No past medical history on file.  Past Medical History, Surgical history, Social history, and Family history were reviewed and updated as appropriate.   Please see review of systems for  further details on the patient's review from today.   Objective:   Physical Exam:  There were no vitals taken for this visit.  Physical Exam Constitutional:      General: He is not in acute distress. Musculoskeletal:        General: No deformity.  Neurological:     Mental Status: He is alert and oriented to person, place, and time.     Coordination: Coordination normal.  Psychiatric:        Attention and Perception: Attention and perception normal. He does not perceive auditory or visual hallucinations.        Mood and Affect: Mood normal. Mood is not anxious or depressed. Affect is not labile, blunt, angry or inappropriate.        Speech: Speech normal.        Behavior: Behavior normal.        Thought Content: Thought content normal. Thought content is not paranoid or delusional. Thought content does not include homicidal or suicidal ideation. Thought content does not include homicidal or suicidal plan.        Cognition and Memory: Cognition and memory normal.        Judgment: Judgment normal.     Comments: Insight intact     Lab Review:  No results found for: "NA", "K", "CL", "CO2", "GLUCOSE", "BUN", "CREATININE", "CALCIUM", "PROT", "ALBUMIN", "AST", "ALT", "ALKPHOS", "BILITOT", "GFRNONAA", "GFRAA"  No results found for: "WBC", "RBC", "HGB", "HCT", "PLT", "MCV", "MCH", "MCHC", "RDW", "LYMPHSABS", "MONOABS", "EOSABS", "BASOSABS"  No results found for: "POCLITH", "LITHIUM"   No results found for: "PHENYTOIN", "PHENOBARB", "VALPROATE", "CBMZ"   .res Assessment: Plan:  Plan:  Continue Wellbutrin XL 150mg  daily - may increase to 300mg  daily. Denies seizure history.  RTC 6 weeks  Patient advised to contact office with any questions, adverse effects, or acute worsening in signs and symptoms.  Time spent with patient was 15 minutes. Greater than 50% of face to face time with patient was spent on counseling and coordination of care.    Diagnoses and all orders for this  visit:  Generalized anxiety disorder  Major depressive disorder, recurrent episode, moderate (HCC)     Please see After Visit Summary for patient specific instructions.  No future appointments.   No orders of the defined types were placed in this encounter.   -------------------------------

## 2022-07-19 ENCOUNTER — Telehealth: Payer: Self-pay

## 2022-07-19 NOTE — Telephone Encounter (Signed)
LVM to RC 

## 2022-07-19 NOTE — Telephone Encounter (Signed)
Rx written on 04/16/2022, is for Bupropion XL 150 mg take one daily for 7 days then increase to 2 daily. Insurance prefers a 300 mg be written instead of 2 of the 150 mg. If not I need to justify the reason for 2/day.   Clarisse Gouge- can you check with pt and confirm her dose so we can get that updated.

## 2022-07-19 NOTE — Telephone Encounter (Signed)
Noted. He started with one tablet daily and was planning to increase dose. We can call and ask what he is taking and adjust from there.

## 2022-07-20 MED ORDER — BUPROPION HCL ER (XL) 300 MG PO TB24: 300.0000 mg | ORAL_TABLET | Freq: Every day | ORAL | 1 refills | Status: DC

## 2022-07-20 NOTE — Telephone Encounter (Signed)
Noted thank you

## 2022-07-20 NOTE — Telephone Encounter (Signed)
Patient taking 2 a day. Sent in new Rx for 300 XL and canceled 150 mg

## 2022-08-18 ENCOUNTER — Ambulatory Visit: Payer: Medicare HMO | Admitting: Adult Health

## 2022-09-15 ENCOUNTER — Other Ambulatory Visit: Payer: Self-pay | Admitting: Adult Health

## 2022-11-16 ENCOUNTER — Other Ambulatory Visit: Payer: Self-pay | Admitting: Adult Health

## 2022-11-16 ENCOUNTER — Ambulatory Visit: Payer: Medicare HMO | Admitting: Adult Health

## 2022-11-24 ENCOUNTER — Ambulatory Visit: Payer: Medicare HMO | Admitting: Adult Health

## 2022-11-24 ENCOUNTER — Encounter: Payer: Self-pay | Admitting: Adult Health

## 2022-11-24 DIAGNOSIS — F331 Major depressive disorder, recurrent, moderate: Secondary | ICD-10-CM

## 2022-11-24 DIAGNOSIS — F411 Generalized anxiety disorder: Secondary | ICD-10-CM | POA: Diagnosis not present

## 2022-11-24 NOTE — Progress Notes (Signed)
Darryl French [id number] 1956/02/15 67 y.o.  Subjective:   Patient ID:  Darryl French is a 67 y.o. (DOB [birth year]) male.  Chief Complaint: No chief complaint on file.   HPI Darryl French presents to the office today for follow-up of anxiety and depression.   Describes mood today as "not too good". Pleasant. Denies tearfulness. Mood symptoms - denies depression, irritability and anxiety. Mood is consistent. Stating "I'm doing better". Feels like the Wellbutrin XL 300mg  daily work well for him. Stable interest and motivation. Taking medications as prescribed Energy levels improved. Active, does not have a regular exercise routine. Enjoys some usual interests and activities. Married. Lives with wife "[name]". Daughter married - 2 grandchildren 63 and 78. Spending time with family Appetite adequate. Weight stable. Sleeps well most nights. Averages 8 or more hours. Napping some in the afternoons. Focus and concentration difficulities. Completing tasks. Managing aspects of household. Retired. Denies SI or HI.  Denies AH or VH. Denies self harm. Denies substance use.    Review of Systems:  Review of Systems  Musculoskeletal:  Negative for gait problem.  Neurological:  Negative for tremors.  Psychiatric/Behavioral:         Please refer to HPI    Medications: I have reviewed the patient's current medications.  Current Outpatient Medications  Medication Sig Dispense Refill   buPROPion (WELLBUTRIN XL) 300 MG 24 hr tablet TAKE ONE TABLET BY MOUTH ONE TIME DAILY 30 tablet 0   No current facility-administered medications for this visit.    Medication Side Effects: None  Allergies:  Allergies  Allergen Reactions   Sulfa Antibiotics Shortness Of Breath    No past medical history on file.  Past Medical History, Surgical history, Social history, and Family history were reviewed and updated as appropriate.   Please see review of systems for further details on the patient's review  from today.   Objective:   Physical Exam:  There were no vitals taken for this visit.  Physical Exam Constitutional:      General: He is not in acute distress. Musculoskeletal:        General: No deformity.  Neurological:     Mental Status: He is alert and oriented to person, place, and time.     Coordination: Coordination normal.  Psychiatric:        Attention and Perception: Attention and perception normal. He does not perceive auditory or visual hallucinations.        Mood and Affect: Mood normal. Mood is not anxious or depressed. Affect is not labile, blunt, angry or inappropriate.        Speech: Speech normal.        Behavior: Behavior normal.        Thought Content: Thought content normal. Thought content is not paranoid or delusional. Thought content does not include homicidal or suicidal ideation. Thought content does not include homicidal or suicidal plan.        Cognition and Memory: Cognition and memory normal.        Judgment: Judgment normal.     Comments: Insight intact     Lab Review:  No results found for: "NA", "K", "CL", "CO2", "GLUCOSE", "BUN", "CREATININE", "CALCIUM", "PROT", "ALBUMIN", "AST", "ALT", "ALKPHOS", "BILITOT", "GFRNONAA", "GFRAA"  No results found for: "WBC", "RBC", "HGB", "HCT", "PLT", "MCV", "MCH", "MCHC", "RDW", "LYMPHSABS", "MONOABS", "EOSABS", "BASOSABS"  No results found for: "POCLITH", "LITHIUM"   No results found for: "PHENYTOIN", "PHENOBARB", "VALPROATE", "CBMZ"   .res Assessment: Plan:  Plan:  Continue Wellbutrin XL 300mg  daily. Denies seizure history.  RTC 6 months  Patient advised to contact office with any questions, adverse effects, or acute worsening in signs and symptoms.  Time spent with patient was 15 minutes. Greater than 50% of face to face time with patient was spent on counseling and coordination of care.    There are no diagnoses linked to this encounter.   Please see After Visit Summary for patient specific  instructions.  No future appointments.  No orders of the defined types were placed in this encounter.   -------------------------------

## 2022-12-20 ENCOUNTER — Other Ambulatory Visit: Payer: Self-pay | Admitting: Adult Health

## 2023-05-17 ENCOUNTER — Telehealth: Payer: Self-pay | Admitting: Adult Health

## 2023-05-17 MED ORDER — BUPROPION HCL ER (XL) 300 MG PO TB24: 300.0000 mg | ORAL_TABLET | Freq: Every day | ORAL | 0 refills | Status: DC

## 2023-05-17 NOTE — Telephone Encounter (Signed)
[facility] called with refill request for Bupropion XL 300mg . Ph: [phone] Patient [phone] Please send to [facility] [address] [place], [place] Appt 9/18

## 2023-05-17 NOTE — Telephone Encounter (Signed)
Sent!

## 2023-05-25 ENCOUNTER — Ambulatory Visit: Payer: Medicare HMO | Admitting: Adult Health

## 2023-05-25 ENCOUNTER — Encounter: Payer: Self-pay | Admitting: Adult Health

## 2023-05-25 DIAGNOSIS — F411 Generalized anxiety disorder: Secondary | ICD-10-CM

## 2023-05-25 DIAGNOSIS — F331 Major depressive disorder, recurrent, moderate: Secondary | ICD-10-CM

## 2023-05-25 MED ORDER — BUPROPION HCL ER (XL) 150 MG PO TB24
150.0000 mg | ORAL_TABLET | Freq: Every day | ORAL | 5 refills | Status: DC
Start: 2023-05-25 — End: 2023-07-19

## 2023-05-25 NOTE — Progress Notes (Signed)
Darryl French [id number] 1955/09/11 67 y.o.  Subjective:   Patient ID:  Darryl French is a 67 y.o. (DOB 04/01/1956) male.  Chief Complaint: No chief complaint on file.   HPI Darryl French presents to the office today for follow-up of anxiety and depression.   Describes mood today as "ok". Pleasant. Denies tearfulness. Mood symptoms - denies depression, irritability and anxiety. Denies panic attacks. Reports some worry, rumination  and over thinking. Mood is better - but feels "too nuetral". Stating "I'm kind of in the middle of things". Feels like the Wellbutrin XL 300mg  daily may be too much and would like to reduce the dose. Stable interest and motivation. Taking medications as prescribed Energy levels improved. Active, does not have a regular exercise routine. Enjoys some usual interests and activities. Married. Lives with wife "[name]". Daughter married - 2 grandchildren 4 and 7. Spending time with family Appetite adequate. Weight stable. Sleeps well most nights. Averages 8 hours of broken sleep. Reports daytime napping. Focus and concentration difficulities. Completing tasks. Managing aspects of household. Retired. Denies SI or HI.  Denies AH or VH. Denies self harm. Denies substance use.    Review of Systems:  Review of Systems  Musculoskeletal:  Negative for gait problem.  Neurological:  Negative for tremors.  Psychiatric/Behavioral:         Please refer to HPI    Medications: I have reviewed the patient's current medications.  Current Outpatient Medications  Medication Sig Dispense Refill   buPROPion (WELLBUTRIN XL) 300 MG 24 hr tablet Take 1 tablet (300 mg total) by mouth daily. 30 tablet 0   No current facility-administered medications for this visit.    Medication Side Effects: None  Allergies:  Allergies  Allergen Reactions   Sulfa Antibiotics Shortness Of Breath    No past medical history on file.  Past Medical History, Surgical history, Social history,  and Family history were reviewed and updated as appropriate.   Please see review of systems for further details on the patient's review from today.   Objective:   Physical Exam:  There were no vitals taken for this visit.  Physical Exam Constitutional:      General: He is not in acute distress. Musculoskeletal:        General: No deformity.  Neurological:     Mental Status: He is alert and oriented to person, place, and time.     Coordination: Coordination normal.  Psychiatric:        Attention and Perception: Attention and perception normal. He does not perceive auditory or visual hallucinations.        Mood and Affect: Mood normal. Mood is not anxious or depressed. Affect is not labile, blunt, angry or inappropriate.        Speech: Speech normal.        Behavior: Behavior normal.        Thought Content: Thought content normal. Thought content is not paranoid or delusional. Thought content does not include homicidal or suicidal ideation. Thought content does not include homicidal or suicidal plan.        Cognition and Memory: Cognition and memory normal.        Judgment: Judgment normal.     Comments: Insight intact     Lab Review:  No results found for: "NA", "K", "CL", "CO2", "GLUCOSE", "BUN", "CREATININE", "CALCIUM", "PROT", "ALBUMIN", "AST", "ALT", "ALKPHOS", "BILITOT", "GFRNONAA", "GFRAA"  No results found for: "WBC", "RBC", "HGB", "HCT", "PLT", "MCV", "MCH", "MCHC", "RDW", "LYMPHSABS", "MONOABS", "EOSABS", "  BASOSABS"  No results found for: "POCLITH", "LITHIUM"   No results found for: "PHENYTOIN", "PHENOBARB", "VALPROATE", "CBMZ"   .res Assessment: Plan:    Plan:  Decrease Wellbutrin XL 300mg  to 150mg  daily. Denies seizure history.  RTC 6 months  Patient advised to contact office with any questions, adverse effects, or acute worsening in signs and symptoms.  Time spent with patient was 15 minutes. Greater than 50% of face to face time with patient was spent on  counseling and coordination of care.    There are no diagnoses linked to this encounter.   Please see After Visit Summary for patient specific instructions.  Future Appointments  Date Time Provider Department Center  05/25/2023 10:20 AM Antrell Tipler, Thereasa Solo, NP [facility] None    No orders of the defined types were placed in this encounter.   -------------------------------

## 2023-07-19 ENCOUNTER — Ambulatory Visit (INDEPENDENT_AMBULATORY_CARE_PROVIDER_SITE_OTHER): Payer: Medicare HMO | Admitting: Adult Health

## 2023-07-19 ENCOUNTER — Encounter: Payer: Self-pay | Admitting: Adult Health

## 2023-07-19 DIAGNOSIS — F331 Major depressive disorder, recurrent, moderate: Secondary | ICD-10-CM | POA: Diagnosis not present

## 2023-07-19 MED ORDER — BUPROPION HCL ER (XL) 150 MG PO TB24: 150.0000 mg | ORAL_TABLET | Freq: Every day | ORAL | 5 refills | Status: DC

## 2023-07-19 NOTE — Progress Notes (Signed)
SARGENT GAPP [id number] Nov 21, 1955 67 y.o.  Subjective:   Patient ID:  Darryl French is a 67 y.o. (DOB 1956/03/28) male.  Chief Complaint: No chief complaint on file.   HPI Darryl French presents to the office today for follow-up of anxiety and depression.   Describes mood today as "ok". Pleasant. Denies tearfulness. Mood symptoms - denies depression, irritability and anxiety. Denies panic attacks. Denies worry, rumination and over thinking. Mood is stable.  Stating "I feel like I'm doing good". Feels like the Wellbutrin XL 150mg  daily works well for him. Stable interest and motivation. Taking medications as prescribed Energy levels improved. Active, does not have a regular exercise routine. Enjoys some usual interests and activities. Married. Lives with wife "[name]". Daughter married - 2 grandchildren. Spending time with family Appetite adequate. Weight stable. Sleeps well most nights. Averages 8 hours of broken sleep.  Focus and concentration difficulities. Completing tasks. Managing aspects of household. Retired. Denies SI or HI.  Denies AH or VH. Denies self harm. Denies substance use.   Review of Systems:  Review of Systems  Musculoskeletal:  Negative for gait problem.  Neurological:  Negative for tremors.  Psychiatric/Behavioral:         Please refer to HPI    Medications: I have reviewed the patient's current medications.  Current Outpatient Medications  Medication Sig Dispense Refill   buPROPion (WELLBUTRIN XL) 150 MG 24 hr tablet Take 1 tablet (150 mg total) by mouth daily. 30 tablet 5   No current facility-administered medications for this visit.    Medication Side Effects: None  Allergies:  Allergies  Allergen Reactions   Sulfa Antibiotics Shortness Of Breath    No past medical history on file.  Past Medical History, Surgical history, Social history, and Family history were reviewed and updated as appropriate.   Please see review of systems for further  details on the patient's review from today.   Objective:   Physical Exam:  There were no vitals taken for this visit.  Physical Exam Constitutional:      General: He is not in acute distress. Musculoskeletal:        General: No deformity.  Neurological:     Mental Status: He is alert and oriented to person, place, and time.     Coordination: Coordination normal.  Psychiatric:        Attention and Perception: Attention and perception normal. He does not perceive auditory or visual hallucinations.        Mood and Affect: Mood normal. Mood is not anxious or depressed. Affect is not labile, blunt, angry or inappropriate.        Speech: Speech normal.        Behavior: Behavior normal.        Thought Content: Thought content normal. Thought content is not paranoid or delusional. Thought content does not include homicidal or suicidal ideation. Thought content does not include homicidal or suicidal plan.        Cognition and Memory: Cognition and memory normal.        Judgment: Judgment normal.     Comments: Insight intact     Lab Review:  No results found for: "NA", "K", "CL", "CO2", "GLUCOSE", "BUN", "CREATININE", "CALCIUM", "PROT", "ALBUMIN", "AST", "ALT", "ALKPHOS", "BILITOT", "GFRNONAA", "GFRAA"  No results found for: "WBC", "RBC", "HGB", "HCT", "PLT", "MCV", "MCH", "MCHC", "RDW", "LYMPHSABS", "MONOABS", "EOSABS", "BASOSABS"  No results found for: "POCLITH", "LITHIUM"   No results found for: "PHENYTOIN", "PHENOBARB", "VALPROATE", "CBMZ"   .res  Assessment: Plan:    Plan:  Decrease Wellbutrin XL 300mg  to 150mg  daily. Denies seizure history.  RTC 6 months  Patient advised to contact office with any questions, adverse effects, or acute worsening in signs and symptoms.  Time spent with patient was 15 minutes. Greater than 50% of face to face time with patient was spent on counseling and coordination of care.    There are no diagnoses linked to this encounter.   Please see  After Visit Summary for patient specific instructions.  No future appointments.  No orders of the defined types were placed in this encounter.   -------------------------------

## 2023-12-16 ENCOUNTER — Other Ambulatory Visit: Payer: Self-pay | Admitting: Adult Health

## 2023-12-16 DIAGNOSIS — F331 Major depressive disorder, recurrent, moderate: Secondary | ICD-10-CM

## 2024-01-09 NOTE — Therapy (Addendum)
 OUTPATIENT PHYSICAL THERAPY LOWER EXTREMITY EVALUATION / DISCHARGE SUMMARY   Patient Name: Darryl French MRN: [id number] DOB:Oct 16, 1955, 68 y.o., male Today's Date: 01/10/2024  END OF SESSION:  PT End of Session - 01/10/24 0927     Visit Number 1    Date for PT Re-Evaluation 03/06/24    Authorization Type [employer]    PT Start Time [phone]    PT Stop Time 1018    PT Time Calculation (min) 51 min    Activity Tolerance Patient tolerated treatment well    Behavior During Therapy [facility] for tasks assessed/performed             History reviewed. No pertinent past medical history. History reviewed. No pertinent surgical history. Patient Active Problem List   Diagnosis Date Noted   Thumb pain 12/12/2015   Mild cognitive disorder 04/17/2014   Generalized anxiety disorder 03/20/2014    PCP: No Pcp Per Patient  REFERRING PROVIDER: Ilda Cadet, PA-C  REFERRING DIAG: M17.11 (ICD-10-CM) - Unilateral primary osteoarthritis, right knee   THERAPY DIAG:  Chronic pain of right knee  Muscle weakness (generalized)  Other abnormalities of gait and mobility  RATIONALE FOR EVALUATION AND TREATMENT: Rehabilitation  ONSET DATE: ~Subacute   NEXT MD VISIT: TBD  SUBJECTIVE:   SUBJECTIVE STATEMENT: Had surgery on R knee in 2017. Having pain when walking or playing disc golf and notices pain on anteromedial knee. R leg feels like it is weaker than L leg.  PAIN:  Are you having pain? Yes: NPRS scale: 0/10 currently, 5-6/10 after playing or form of exercise Pain location: anteromedial R knee Pain description: sharp Aggravating factors: playing sports or after activity Relieving factors: motrin, heating pad  PERTINENT HISTORY: OA, Generalized Anxiety Disorder, pterygium, proximal MCL tear s/p arthroplasty  PRECAUTIONS: None  RED FLAGS: None   WEIGHT BEARING RESTRICTIONS: No  FALLS:  Has patient fallen in last 6 months? No  LIVING ENVIRONMENT: Lives with: lives with their  spouse Lives in: House/apartment Stairs: Yes: Internal: 12-16 steps; on left going up and External: 2-3 steps; none Has following equipment at home: Single point cane and Hemi walker  OCCUPATION: Retired  PLOF: Independent and Leisure: walk, disc golf, keeping up with grandkids, hiking  PATIENT GOALS: Strengthen knees to alleviate pain    OBJECTIVE:  Note: Objective measures were completed at Evaluation unless otherwise noted.  DIAGNOSTIC FINDINGS:  12/08/2023 - XR Right Knee IMPRESSION: Mild medial and moderate patellofemoral degenerative change with  trace knee effusion.   01/31/2016 - MRI OF THE RIGHT KNEE WITHOUT CONTRAST IMPRESSION: 1. Radial tear posterior horn medial meniscus at the meniscal root. 2. Mild grade 2 sprain of the proximal MCL. 3. Moderate knee effusion. 4. Mild chondral fissuring along the medial femoral condyle articular surface. 5. Focal chondral thinning along an articular surface spur of the posterior portion of the lateral femoral condyle.   PATIENT SURVEYS:  LEFS 55 / 80 = 68.8 %  COGNITION: Overall cognitive status: Within functional limits for tasks assessed     SENSATION: Tingling in R knee  MUSCLE LENGTH: ITB: mild tightness Quad (Prone): mild tightness B HS: mild tightness B  POSTURE: rounded shoulders, forward head, and flexed trunk   PALPATION: No TTP or swelling noted in R knee  LOWER EXTREMITY ROM:  Active ROM Right eval Left eval  Knee flexion 117 sitting / 135 supine 120   Knee extension 2 0   (Blank rows = not tested)  LOWER EXTREMITY MMT:  MMT Right eval  Left eval  Hip flexion 4+ 5  Hip extension 4- 4-  Hip abduction 4 4-  Hip adduction 4 4  Hip internal rotation 4- 4-  Hip external rotation 4 4  Knee flexion 4+ 4  Knee extension 5 5  Ankle dorsiflexion 5 5  Ankle plantarflexion    Ankle inversion    Ankle eversion     (Blank rows = not tested)                                                                                                                               TREATMENT DATE:    01/10/24 - EVAL THERAPEUTIC EXERCISE: To improve strength, endurance, ROM, and flexibility.  Demonstration, verbal and tactile cues throughout for technique. Supine HS stretch w/ strap x 30 B Seated HS stretch - didn't feel stretch in HS Seated HS stretch w/ strap - cued to bend at hip to feel stretch Gastroc stretch on wall - x 30 B cued to keep B heels on ground Bridge + ADD ball squeeze - x 10 Seated LAQ + Ball squeeze x 10 - told not to hold due to increase in R knee pain  PATIENT EDUCATION:  Education details: PT eval findings, anticipated POC, and initial HEP  Person educated: Patient Education method: Explanation, Demonstration, Tactile cues, Verbal cues, and Handouts Education comprehension: verbalized understanding, returned demonstration, verbal cues required, tactile cues required, and needs further education  HOME EXERCISE PROGRAM: Access Code: [email or web address] URL: [email or web address] Date: 01/10/2024 Prepared by: Khloey Chern French  Exercises - Supine Hamstring Stretch with Strap  - 1 x daily - 7 x weekly - 30 hold - Supine Bridge with Mini Swiss Ball Between Knees  - 1 x daily - 5 x weekly - 2 sets - 10 reps - Gastroc Stretch on Wall  - 2 x daily - 7 x weekly - 2 sets - 30 sec hold - Seated Long Arc Quad with Hip Adduction  - 1 x daily - 5 x weekly - 2 sets - 10 reps  ASSESSMENT:  CLINICAL IMPRESSION: Darryl French is a 68 y.o male who was seen today for physical therapy evaluation and treatment for chronic R knee pain. Has previous hx of R knee arthroscopy and PT in 2017. He has normal knee ROM but has pain in knee flexion and shows deficits in LE strength and tingling in R knee which are interfering with ADLs, participating in disc golf, walking, and activities that require squatting. Wants to be able to participate in these activities without being limited by R knee pain. On LEFS, he  scored 55/80 demonstrating 68.8% or minimal disability. Darryl French will benefit from skilled PT to address above deficits to improve mobility and activity tolerance with decreased pain interference.  OBJECTIVE IMPAIRMENTS: decreased activity tolerance, decreased coordination, decreased endurance, decreased knowledge of condition, decreased mobility, difficulty walking, decreased ROM, decreased strength, increased fascial restrictions, impaired perceived functional ability, increased muscle spasms, impaired flexibility, impaired sensation,  impaired tone, improper body mechanics, postural dysfunction, and pain.   ACTIVITY LIMITATIONS: lifting, squatting, stairs, bed mobility, and locomotion level  PARTICIPATION LIMITATIONS: meal prep, cleaning, laundry, community activity, and yard work  PERSONAL FACTORS: Past/current experiences, Time since onset of injury/illness/exacerbation, and 3+ comorbidities: OA, Generalized Anxiety Disorder, pterygium, proximal MCL tear s/p arthroscopy 02/2016 are also affecting patient's functional outcome.   REHAB POTENTIAL: Good  CLINICAL DECISION MAKING: Stable/uncomplicated  EVALUATION COMPLEXITY: Low   GOALS: Goals reviewed with patient? Yes  SHORT TERM GOALS: Target date: 02/07/2024  1.  Pt will be independent with initial HEP to improve outcomes and carryover Baseline:  Goal status: INITIAL  2.  Pt will report 25% improvement in R knee pain to improve QOL Baseline:  Goal status: INITIAL  LONG TERM GOALS: Target date: 03/06/2024  1.  Pt will demonstrate improved LE strength to >/= 4+/5 for improved stability and ease of mobility  Baseline:  Goal status: INITIAL  2.  Pt will report >/= 64/80 on LEFS (MCID = 9 pts) to demonstrate improved functional ability  Baseline: 55/80 = 68.8% Goal status: INITIAL  3.  Pt will report 50-75% improvement in R knee pain to improve QOL  Baseline:  Goal status: INITIAL  4.  Pt will walk > 1 mile w/o increased R knee pain  to improve mobility and activity tolerance Baseline: Moderate difficulty Goal status: INITIAL  5.  Pt will demonstrate improved functional mobility and LE strength to engage in disc golf for at least 30 min w/ <= 4/10 pain Baseline:  Goal status: INITIAL  6.  Pt will be able to ascend/descend stairs with 1 HR and reciprocal step pattern safely to access home and community  Baseline:  Goal status: INITIAL  7.  Pt will demonstrate full pain free R knee ROM to perform ADLs    Baseline:  Goal status: INITIAL  PLAN:  PT FREQUENCY: 2x/week  PT DURATION: 8 weeks  PLANNED INTERVENTIONS: 02835- PT Re-evaluation, 97750- Physical Performance Testing, 97110-Therapeutic exercises, 97530- Therapeutic activity, [id number]- Neuromuscular re-education, 97535- Self Care, 02859- Manual therapy, [phone]- Gait training, [phone]- Aquatic Therapy, [phone]- Electrical stimulation (unattended), 97016- Vasopneumatic device, 97035- Ultrasound, Patient/Family education, Stair training, Taping, Dry Needling, Joint mobilization, Cryotherapy, and Moist heat  PLAN FOR NEXT SESSION: Measure L knee flexion in supine; Review initial HEP; progress LE flexibility/ROM; progress LE/core strengthening; introduce kinesiotaping    Darryl French, Student-PT 01/10/2024, 11:22 AM   PHYSICAL THERAPY DISCHARGE SUMMARY  Visits from Start of Care: 1  Current functional level related to goals / functional outcomes: Refer to above clinical impression and goal assessment for status as of eval visit on 01/10/2024. Patient canceled all subsequent treatment visits stating she was doing better.  She has not returned to PT in >30 days, therefore will proceed with discharge from PT for this episode.     Remaining deficits: As above.  Eval only.     Education / Equipment: Initial HEP  Patient agrees to discharge. Patient goals were not met. Patient is being discharged due to being pleased with the current functional level.  Darryl French, PT 03/22/2024, 12:25 PM  [facility] [address]  [address] [place], [place], [address] Phone: [phone]   Fax:  [phone]

## 2024-01-10 ENCOUNTER — Other Ambulatory Visit: Payer: Self-pay

## 2024-01-10 ENCOUNTER — Ambulatory Visit: Attending: Family Medicine | Admitting: Physical Therapy

## 2024-01-10 ENCOUNTER — Encounter: Payer: Self-pay | Admitting: Physical Therapy

## 2024-01-10 DIAGNOSIS — M6281 Muscle weakness (generalized): Secondary | ICD-10-CM | POA: Insufficient documentation

## 2024-01-10 DIAGNOSIS — G8929 Other chronic pain: Secondary | ICD-10-CM | POA: Insufficient documentation

## 2024-01-10 DIAGNOSIS — R2689 Other abnormalities of gait and mobility: Secondary | ICD-10-CM | POA: Diagnosis present

## 2024-01-10 DIAGNOSIS — M25561 Pain in right knee: Secondary | ICD-10-CM | POA: Insufficient documentation

## 2024-01-16 ENCOUNTER — Encounter: Payer: Self-pay | Admitting: Adult Health

## 2024-01-16 ENCOUNTER — Ambulatory Visit: Payer: Medicare HMO | Admitting: Adult Health

## 2024-01-16 DIAGNOSIS — F331 Major depressive disorder, recurrent, moderate: Secondary | ICD-10-CM | POA: Diagnosis not present

## 2024-01-16 DIAGNOSIS — F411 Generalized anxiety disorder: Secondary | ICD-10-CM | POA: Diagnosis not present

## 2024-01-16 MED ORDER — BUPROPION HCL ER (XL) 150 MG PO TB24: 150.0000 mg | ORAL_TABLET | Freq: Every day | ORAL | 5 refills | Status: DC

## 2024-01-16 NOTE — Progress Notes (Signed)
 Darryl French [id number] [birth year] 68 y.o.  Subjective:   Patient ID:  Darryl French is a 68 y.o. (DOB 12-Aug-1956) male.  Chief Complaint: No chief complaint on file.   HPI RATHA HARTSEL presents to the office today for follow-up of anxiety and depression.   Describes mood today as "ok". Pleasant. Denies tearfulness. Mood symptoms - denies depression, anxiety and irritability. Reports stable interest and motivation. Denies panic attacks. Denies worry, rumination and over thinking. Mood is stable. Stating "I feel like I'm doing ok". Feels like the Wellbutrin  XL 150mg  daily works well for him. Taking medications as prescribed. Energy levels improved. Active, does not have a regular exercise routine. Enjoys some usual interests and activities. Married. Lives with wife "Cornelius Dill". Daughter married - 2 grandchildren. Spending time with family Appetite adequate. Weight stable. Sleeps well most nights. Averages 8 hours of broken sleep. Reports some daytime napping  Focus and concentration difficulities. Completing tasks. Managing aspects of household. Retired. Denies SI or HI.  Denies AH or VH. Denies self harm. Denies substance use.    Review of Systems:  Review of Systems  Musculoskeletal:  Negative for gait problem.  Neurological:  Negative for tremors.  Psychiatric/Behavioral:         Please refer to HPI    Medications: I have reviewed the patient's current medications.  Current Outpatient Medications  Medication Sig Dispense Refill   buPROPion  (WELLBUTRIN  XL) 150 MG 24 hr tablet TAKE ONE TABLET BY MOUTH ONE TIME DAILY 30 tablet 1   No current facility-administered medications for this visit.    Medication Side Effects: None  Allergies:  Allergies  Allergen Reactions   Sulfa Antibiotics Shortness Of Breath    No past medical history on file.  Past Medical History, Surgical history, Social history, and Family history were reviewed and updated as appropriate.   Please see  review of systems for further details on the patient's review from today.   Objective:   Physical Exam:  There were no vitals taken for this visit.  Physical Exam Constitutional:      General: He is not in acute distress. Musculoskeletal:        General: No deformity.  Neurological:     Mental Status: He is alert and oriented to person, place, and time.     Coordination: Coordination normal.  Psychiatric:        Attention and Perception: Attention and perception normal. He does not perceive auditory or visual hallucinations.        Mood and Affect: Mood normal. Mood is not anxious or depressed. Affect is not labile, blunt, angry or inappropriate.        Speech: Speech normal.        Behavior: Behavior normal.        Thought Content: Thought content normal. Thought content is not paranoid or delusional. Thought content does not include homicidal or suicidal ideation. Thought content does not include homicidal or suicidal plan.        Cognition and Memory: Cognition and memory normal.        Judgment: Judgment normal.     Comments: Insight intact     Lab Review:  No results found for: "NA", "K", "CL", "CO2", "GLUCOSE", "BUN", "CREATININE", "CALCIUM", "PROT", "ALBUMIN", "AST", "ALT", "ALKPHOS", "BILITOT", "GFRNONAA", "GFRAA"  No results found for: "WBC", "RBC", "HGB", "HCT", "PLT", "MCV", "MCH", "MCHC", "RDW", "LYMPHSABS", "MONOABS", "EOSABS", "BASOSABS"  No results found for: "POCLITH", "LITHIUM"   No results found for: "PHENYTOIN", "PHENOBARB", "VALPROATE", "  CBMZ"   .res Assessment: Plan:    Plan:  Decrease Wellbutrin  XL 150mg  daily. Denies seizure history.  RTC 6 months  Patient advised to contact office with any questions, adverse effects, or acute worsening in signs and symptoms.  15 minutes spent dedicated to the care of this patient on the date of this encounter to include pre-visit review of records, ordering of medication, post visit documentation, and  face-to-face time with the patient discussing depression and anxiety. Discussed continuing current medication regimen.  Diagnoses and all orders for this visit:  Major depressive disorder, recurrent episode, moderate (HCC)  Generalized anxiety disorder     Please see After Visit Summary for patient specific instructions.  Future Appointments  Date Time Provider Department Center  01/19/2024  1:15 PM Francisco Irving, PT [facility]  02/01/2024  8:00 AM Samuella Crocker, PTA [facility]  02/07/2024  9:30 AM Francisco Irving, PT [facility]    No orders of the defined types were placed in this encounter.   -------------------------------

## 2024-01-19 ENCOUNTER — Ambulatory Visit: Admitting: Physical Therapy

## 2024-01-25 ENCOUNTER — Encounter

## 2024-02-01 ENCOUNTER — Encounter

## 2024-02-07 ENCOUNTER — Encounter: Admitting: Physical Therapy

## 2024-07-18 ENCOUNTER — Other Ambulatory Visit: Payer: Self-pay | Admitting: Adult Health

## 2024-07-18 DIAGNOSIS — F331 Major depressive disorder, recurrent, moderate: Secondary | ICD-10-CM

## 2024-07-24 ENCOUNTER — Encounter: Payer: Self-pay | Admitting: Adult Health

## 2024-07-24 ENCOUNTER — Ambulatory Visit: Admitting: Adult Health

## 2024-07-24 DIAGNOSIS — F331 Major depressive disorder, recurrent, moderate: Secondary | ICD-10-CM | POA: Diagnosis not present

## 2024-07-24 MED ORDER — DULOXETINE HCL 30 MG PO CPEP: 30.0000 mg | ORAL_CAPSULE | Freq: Every day | ORAL | 5 refills | Status: DC

## 2024-07-24 MED ORDER — BUPROPION HCL ER (XL) 150 MG PO TB24: 150.0000 mg | ORAL_TABLET | Freq: Every day | ORAL | 3 refills | Status: AC

## 2024-07-24 NOTE — Progress Notes (Addendum)
 Darryl French [id number] 28-Feb-1956 68 y.o.  Subjective:   Patient ID:  Darryl French is a 68 y.o. (DOB 16-Jul-1956) male.  Chief Complaint: No chief complaint on file.   HPI KA FLAMMER presents to the office today for follow-up of anxiety and depression.   Describes mood today as ok. Pleasant. Denies tearfulness. Mood symptoms - reports decreased depression, anxiety and irritability. Reports stable interest and motivation. Denies panic attacks. Denies worry, rumination and over thinking. Reports mood is stable. Stating I feel like I'm doing ok. Feels like the Wellbutrin  XL 150mg  daily works well for him. Would like to add a low dose of Cymbalta for mood symptoms.  Taking medications as prescribed. Energy levels stable. Active, does not have a regular exercise routine. Enjoys some usual interests and activities. Married. Lives with wife Erminio. Daughter married - 2 grandchildren. Spending time with family Appetite adequate. Weight stable. Sleeps better some nights than others. Averages 8 hours of broken sleep. Reports some daytime napping  Reports focus and concentration stable - it's a lot better. Completing tasks. Managing aspects of household. Retired. Denies SI or HI.  Denies AH or VH. Denies self harm. Denies substance use.    Review of Systems:  Review of Systems  Musculoskeletal:  Negative for gait problem.  Neurological:  Negative for tremors.  Psychiatric/Behavioral:         Please refer to HPI    Medications: I have reviewed the patient's current medications.  Current Outpatient Medications  Medication Sig Dispense Refill   buPROPion  (WELLBUTRIN  XL) 150 MG 24 hr tablet TAKE ONE TABLET BY MOUTH ONE TIME DAILY 30 tablet 0   No current facility-administered medications for this visit.    Medication Side Effects: None  Allergies:  Allergies  Allergen Reactions   Sulfa Antibiotics Shortness Of Breath    No past medical history on file.  Past Medical  History, Surgical history, Social history, and Family history were reviewed and updated as appropriate.   Please see review of systems for further details on the patient's review from today.   Objective:   Physical Exam:  There were no vitals taken for this visit.  Physical Exam Constitutional:      General: He is not in acute distress. Musculoskeletal:        General: No deformity.  Neurological:     Mental Status: He is alert and oriented to person, place, and time.     Coordination: Coordination normal.  Psychiatric:        Attention and Perception: Attention and perception normal. He does not perceive auditory or visual hallucinations.        Mood and Affect: Mood normal. Mood is not anxious or depressed. Affect is not labile, blunt, angry or inappropriate.        Speech: Speech normal.        Behavior: Behavior normal.        Thought Content: Thought content normal. Thought content is not paranoid or delusional. Thought content does not include homicidal or suicidal ideation. Thought content does not include homicidal or suicidal plan.        Cognition and Memory: Cognition and memory normal.        Judgment: Judgment normal.     Comments: Insight intact     Lab Review:  No results found for: NA, K, CL, CO2, GLUCOSE, BUN, CREATININE, CALCIUM, PROT, ALBUMIN, AST, ALT, ALKPHOS, BILITOT, GFRNONAA, GFRAA  No results found for: WBC, RBC, HGB, HCT, PLT, MCV,  MCH, MCHC, RDW, LYMPHSABS, MONOABS, EOSABS, BASOSABS  No results found for: POCLITH, LITHIUM   No results found for: PHENYTOIN, PHENOBARB, VALPROATE, CBMZ   .res Assessment: Plan:    Plan:  Wellbutrin  XL 150mg  daily. Denies seizure history.  Add Cymbalta 30mg  daily for anxiety and depression  RTC 4 weeks - phone call  Patient advised to contact office with any questions, adverse effects, or acute worsening in signs and symptoms.  20 minutes  spent dedicated to the care of this patient on the date of this encounter to include pre-visit review of records, ordering of medication, post visit documentation, and face-to-face time with the patient discussing depression and anxiety. Discussed continuing current medication regimen.  There are no diagnoses linked to this encounter.   Please see After Visit Summary for patient specific instructions.  Future Appointments  Date Time Provider Department Center  07/24/2024  9:00 AM Evy Lutterman Nattalie, NP [facility] None    No orders of the defined types were placed in this encounter.   -------------------------------

## 2024-08-21 ENCOUNTER — Ambulatory Visit: Admitting: Adult Health

## 2024-08-21 ENCOUNTER — Encounter: Payer: Self-pay | Admitting: Adult Health

## 2024-08-21 DIAGNOSIS — F32A Depression, unspecified: Secondary | ICD-10-CM | POA: Diagnosis not present

## 2024-08-21 DIAGNOSIS — F411 Generalized anxiety disorder: Secondary | ICD-10-CM

## 2024-08-21 DIAGNOSIS — F419 Anxiety disorder, unspecified: Secondary | ICD-10-CM | POA: Diagnosis not present

## 2024-08-21 DIAGNOSIS — F331 Major depressive disorder, recurrent, moderate: Secondary | ICD-10-CM

## 2024-08-21 MED ORDER — DULOXETINE HCL 30 MG PO CPEP: 30.0000 mg | ORAL_CAPSULE | Freq: Every day | ORAL | 3 refills | Status: AC

## 2024-08-21 NOTE — Progress Notes (Signed)
 Darryl French [id number] 29-May-1956 68 y.o.  Subjective:   Patient ID:  Darryl French is a 68 y.o. (DOB 1956/02/18) male.  Chief Complaint: No chief complaint on file.   HPI Darryl French presents to the office today for follow-up of anxiety and depression.   Describes mood today as ok. Pleasant. Denies tearfulness. Mood symptoms - denies depression, anxiety and irritability. Reports stable interest and motivation. Denies panic attacks. Denies worry, rumination and over thinking. Reports mood is stable. Stating I feel like I'm doing better. Feels like current medication works well. Taking medications as prescribed. Energy levels stable. Active, does not have a regular exercise routine. Enjoys some usual interests and activities. Married. Lives with wife Erminio. Daughter married - 2 grandchildren. Spending time with family Appetite adequate. Weight stable. Sleeps better some nights than others. Averages 8 hours of broken sleep - it is better. Reports some daytime napping  Reports stable focus and concentration. Completing tasks. Managing aspects of household. Retired. Denies SI or HI.  Denies AH or VH. Denies self harm. Denies substance use.   Review of Systems:  Review of Systems  Musculoskeletal:  Negative for gait problem.  Neurological:  Negative for tremors.  Psychiatric/Behavioral:         Please refer to HPI    Medications: I have reviewed the patient's current medications.  Current Outpatient Medications  Medication Sig Dispense Refill   buPROPion  (WELLBUTRIN  XL) 150 MG 24 hr tablet Take 1 tablet (150 mg total) by mouth daily. 90 tablet 3   DULoxetine  (CYMBALTA ) 30 MG capsule Take 1 capsule (30 mg total) by mouth daily. 30 capsule 5   No current facility-administered medications for this visit.    Medication Side Effects: None  Allergies: Allergies[1]  No past medical history on file.  Past Medical History, Surgical history, Social history, and Family  history were reviewed and updated as appropriate.   Please see review of systems for further details on the patient's review from today.   Objective:   Physical Exam:  There were no vitals taken for this visit.  Physical Exam Constitutional:      General: He is not in acute distress. Musculoskeletal:        General: No deformity.  Neurological:     Mental Status: He is alert and oriented to person, place, and time.     Coordination: Coordination normal.  Psychiatric:        Attention and Perception: Attention and perception normal. He does not perceive auditory or visual hallucinations.        Mood and Affect: Mood normal. Mood is not anxious or depressed. Affect is not labile, blunt, angry or inappropriate.        Speech: Speech normal.        Behavior: Behavior normal.        Thought Content: Thought content normal. Thought content is not paranoid or delusional. Thought content does not include homicidal or suicidal ideation. Thought content does not include homicidal or suicidal plan.        Cognition and Memory: Cognition and memory normal.        Judgment: Judgment normal.     Comments: Insight intact     Lab Review:  No results found for: NA, K, CL, CO2, GLUCOSE, BUN, CREATININE, CALCIUM, PROT, ALBUMIN, AST, ALT, ALKPHOS, BILITOT, GFRNONAA, GFRAA  No results found for: WBC, RBC, HGB, HCT, PLT, MCV, MCH, MCHC, RDW, LYMPHSABS, MONOABS, EOSABS, BASOSABS  No results found for: POCLITH, LITHIUM  No results found for: PHENYTOIN, PHENOBARB, VALPROATE, CBMZ   .res Assessment: Plan:    Plan:  Wellbutrin  XL 150mg  daily. Denies seizure history. Cymbalta  30mg  daily for anxiety and depression  RTC 6 months  Patient advised to contact office with any questions, adverse effects, or acute worsening in signs and symptoms.  20 minutes spent dedicated to the care of this patient on the date of this encounter  to include pre-visit review of records, ordering of medication, post visit documentation, and face-to-face time with the patient discussing depression and anxiety. Discussed continuing current medication regimen.  There are no diagnoses linked to this encounter.   Please see After Visit Summary for patient specific instructions.  Future Appointments  Date Time Provider Department Center  08/21/2024  9:00 AM Delmon Andrada Nattalie, NP [facility] None    No orders of the defined types were placed in this encounter.   -------------------------------     [1]  Allergies Allergen Reactions   Sulfa Antibiotics Shortness Of Breath

## 2024-08-22 ENCOUNTER — Telehealth: Payer: Self-pay | Admitting: Adult Health

## 2024-08-22 NOTE — Telephone Encounter (Signed)
 Darryl French called and said that he decided to increase his duloxetine  to two tablets a day in the am. He  said that he will need a new script sent in to the pharmacy.

## 2024-08-23 MED ORDER — DULOXETINE HCL 60 MG PO CPEP: 60.0000 mg | ORAL_CAPSULE | Freq: Every day | ORAL | 1 refills | Status: AC

## 2024-08-23 NOTE — Telephone Encounter (Signed)
 LVM to [facility]

## 2024-08-23 NOTE — Telephone Encounter (Signed)
Rx for 60 mg sent.

## 2024-08-23 NOTE — Telephone Encounter (Signed)
 Pt seen 12/16. He said he has decided he wants to take 60 mg duloxetine  instead of the 30 prescribed. Ok to send?

## 2025-02-19 ENCOUNTER — Ambulatory Visit: Admitting: Adult Health
